# Patient Record
Sex: Female | Born: 1995 | Race: White | Hispanic: Yes | Marital: Single | Smoking: Former smoker
Health system: Southern US, Community
[De-identification: ages and names within clinical notes are randomized; demographics above are authoritative.]

## PROBLEM LIST (undated history)

## (undated) ENCOUNTER — Inpatient Hospital Stay (HOSPITAL_COMMUNITY): Payer: Self-pay

## (undated) DIAGNOSIS — E669 Obesity, unspecified: Secondary | ICD-10-CM

## (undated) DIAGNOSIS — J45909 Unspecified asthma, uncomplicated: Secondary | ICD-10-CM

---
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
## 2016-03-01 ENCOUNTER — Ambulatory Visit: Payer: Medicaid Other | Admitting: Family Medicine

## 2016-03-01 ENCOUNTER — Encounter: Payer: Self-pay | Admitting: Family Medicine

## 2016-03-23 ENCOUNTER — Ambulatory Visit: Payer: Medicaid Other | Admitting: Obstetrics & Gynecology

## 2016-03-29 IMAGING — US US OB DETAIL+14 WK
1 series · 12 of 28 positions shown · non-contrast
Comparison: none

[Series 1: us ob detail +14 wk · 12 of 100 slices shown]
[im 4/100]
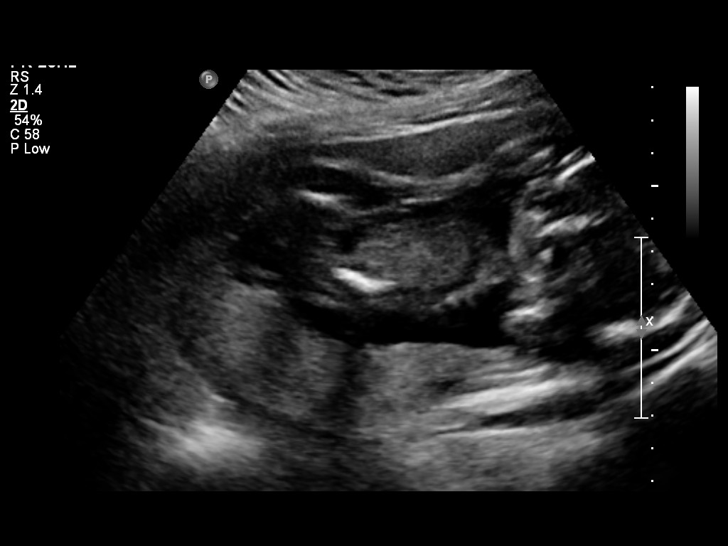
[im 12/100]
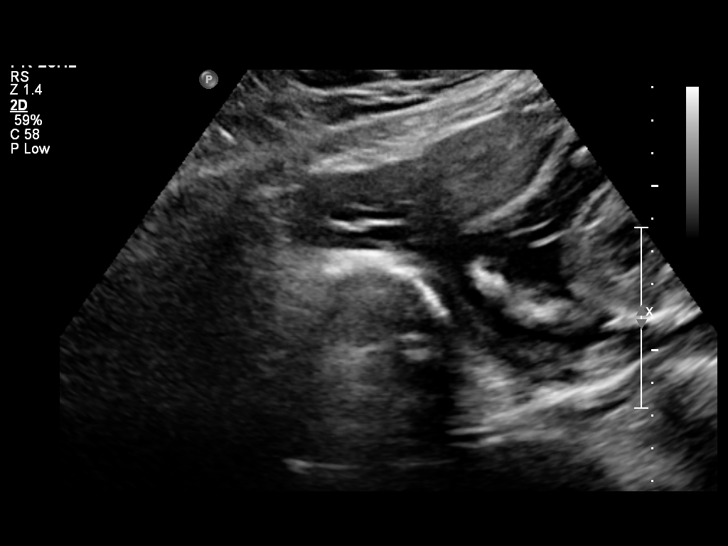
[im 19/100]
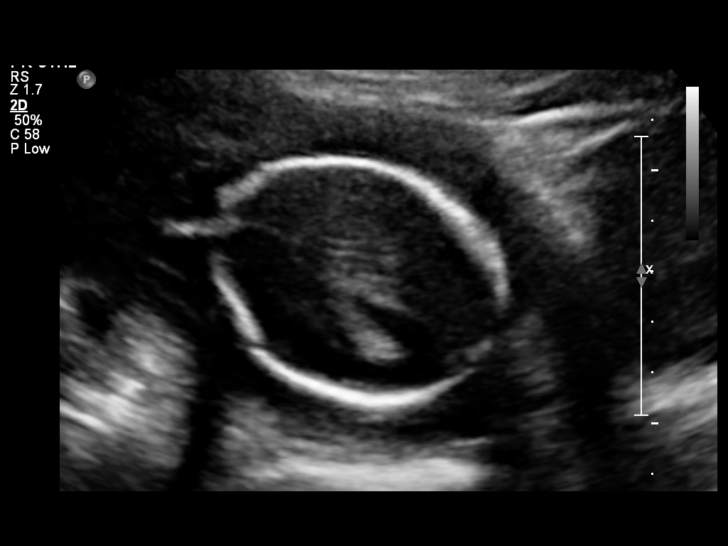
[im 30/100]
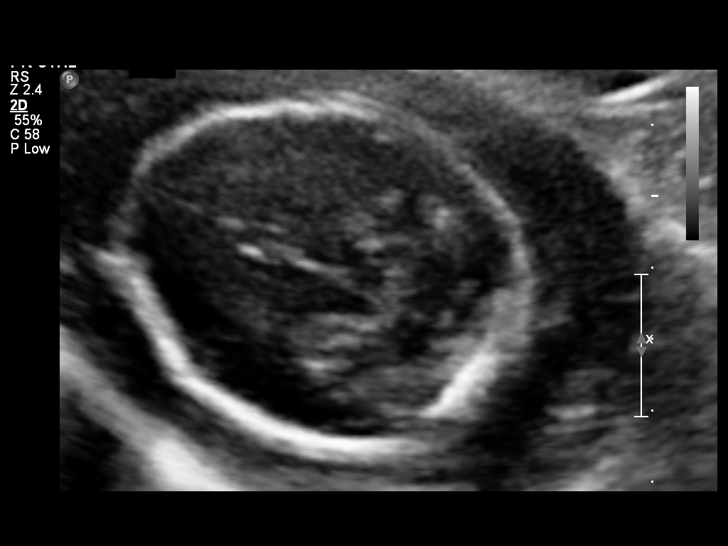
[im 37/100]
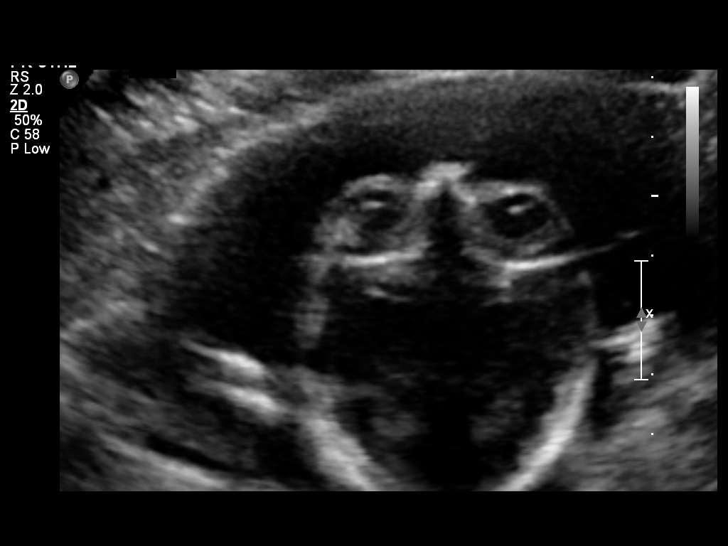
[im 45/100]
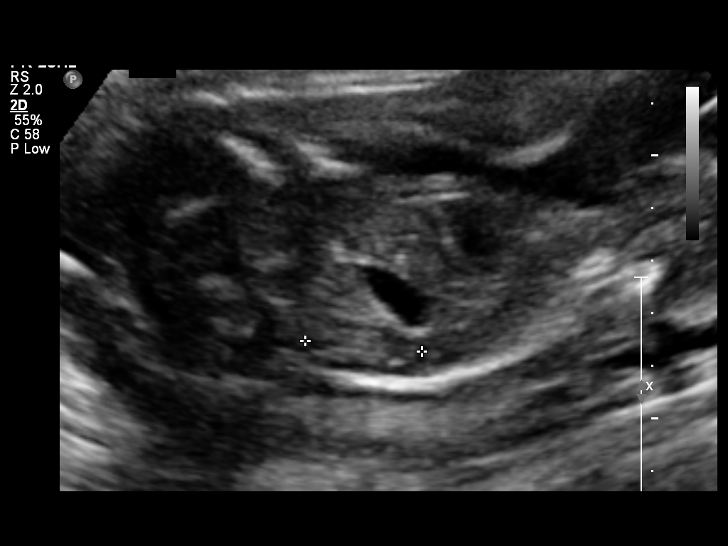
[im 56/100]
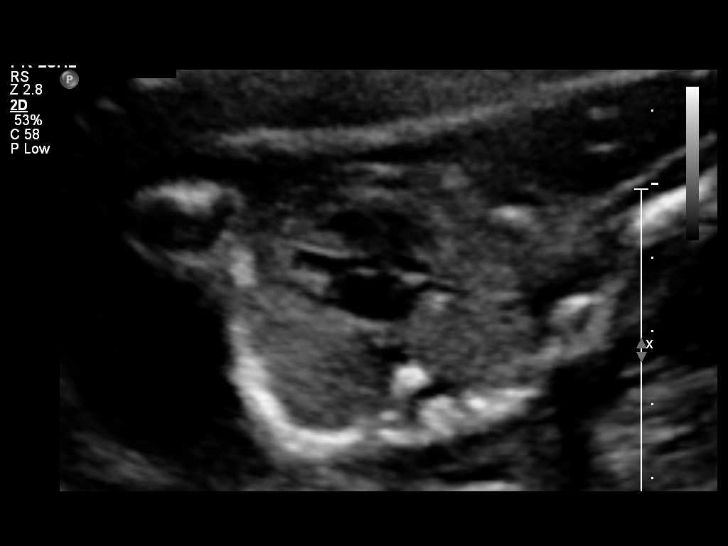
[im 63/100]
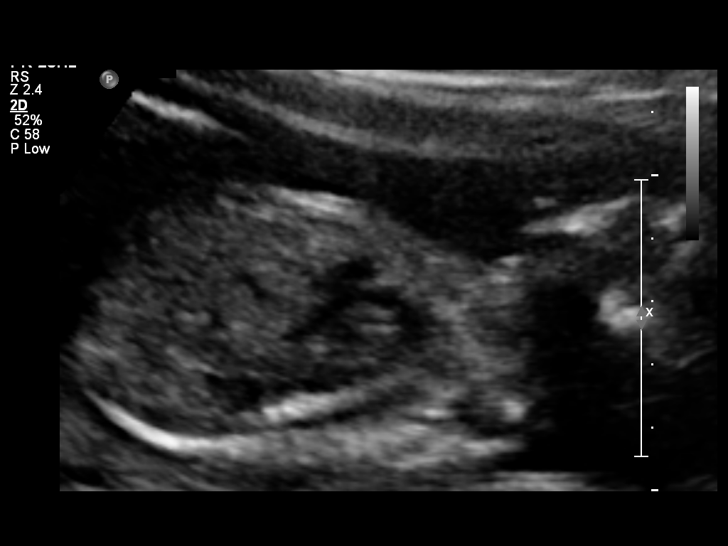
[im 70/100]
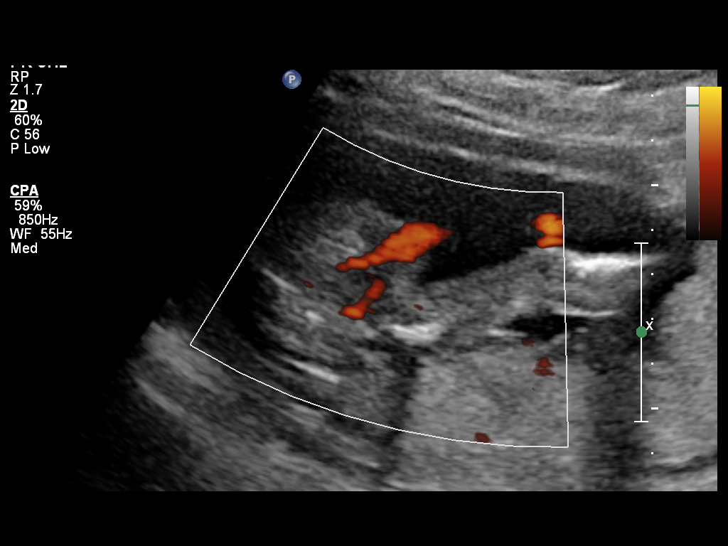
[im 81/100]
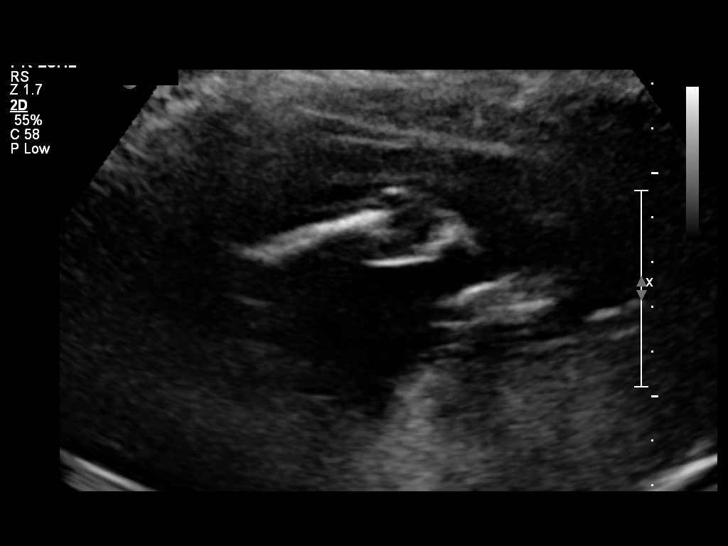
[im 89/100]
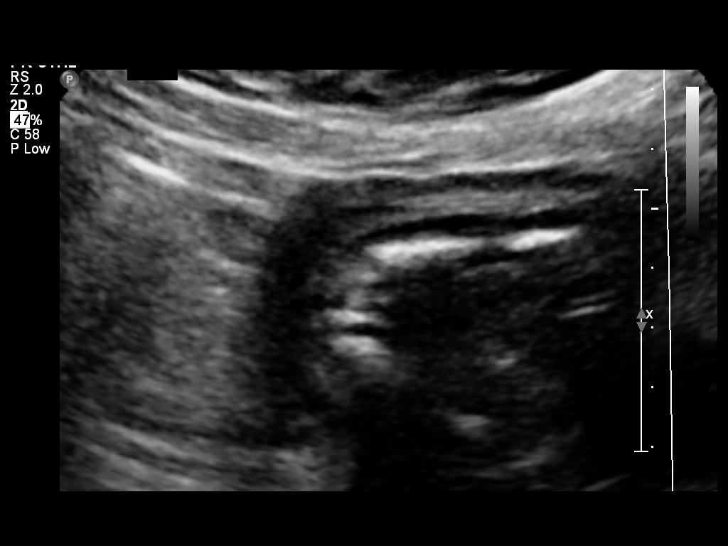
[im 96/100]
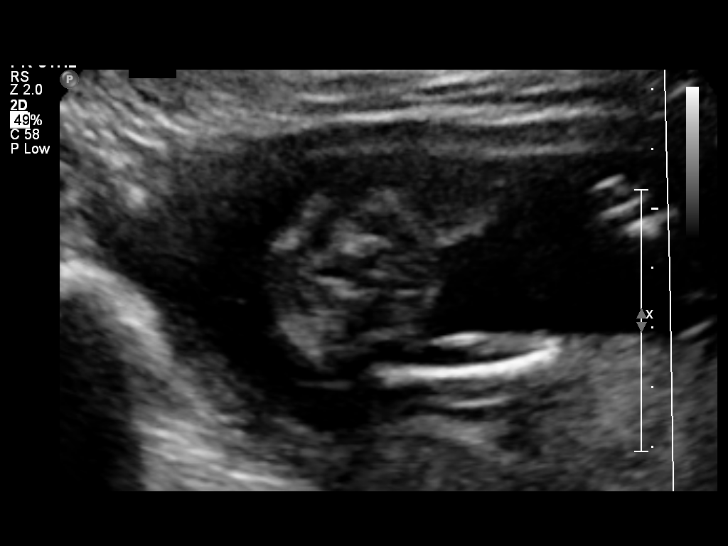

[12 of 28 positions shown; findings below may reference images not displayed]

OBSTETRICS REPORT
                      (Signed Final 12/01/2014 [DATE])

Service(s) Provided

 US OB DETAIL + 14 WK                                  76811.0
Indications

 Teen pregnancy
 Obesity complicating pregnancy BMI
 19 weeks gestation of pregnancy
Fetal Evaluation

 Num Of Fetuses:    1
 Fetal Heart Rate:  158                          bpm
 Cardiac Activity:  Observed
 Presentation:      Cephalic
 Placenta:          Posterior, above cervical
                    os
 P. Cord            Visualized, central
 Insertion:

 Amniotic Fluid
 AFI FV:      Subjectively within normal limits
                                             Larg Pckt:     4.5  cm
Biometry

 BPD:     45.3  mm     G. Age:  19w 5d                CI:        74.02   70 - 86
                                                      FL/HC:      20.4   16.8 -

 HC:     167.2  mm     G. Age:  19w 3d       35  %    HC/AC:      1.09   1.09 -

 AC:     153.4  mm     G. Age:  20w 4d       76  %    FL/BPD:
 FL:      34.1  mm     G. Age:  20w 5d       80  %    FL/AC:      22.2   20 - 24
 HUM:     29.7  mm     G. Age:  19w 5d       56  %
 CER:     18.6  mm     G. Age:  18w 2d       14  %
 NFT:     3.95  mm

 Est. FW:     355  gm    0 lb 13 oz      60  %
Gestational Age

 LMP:           24w 2d        Date:  06/14/14                 EDD:   03/21/15
 U/S Today:     20w 1d                                        EDD:   04/19/15
 Best:          19w 4d     Det. By:  Early Ultrasound         EDD:   04/23/15
                                     (08/30/14)
Anatomy

 Cranium:          Appears normal         Aortic Arch:      Appears normal
 Fetal Cavum:      Appears normal         Ductal Arch:      Appears normal
 Ventricles:       Appears normal         Diaphragm:        Not well visualized
 Choroid Plexus:   Appears normal         Stomach:          Appears normal, left
                                                            sided
 Cerebellum:       Appears normal         Abdomen:          Appears normal
 Posterior Fossa:  Appears normal         Abdominal Wall:   Appears nml (cord
                                                            insert, abd wall)
 Nuchal Fold:      Appears normal         Cord Vessels:     Appears normal (3
                                                            vessel cord)
 Face:             Appears normal         Kidneys:          Appear normal
                   (orbits and profile)
 Lips:             Appears normal         Bladder:          Appears normal
 Heart:            Not well visualized    Spine:            Limited views
                                                            appear normal
 RVOT:             Appears normal         Lower             Appears normal
                                          Extremities:
 LVOT:             Appears normal         Upper             Appears normal
                                          Extremities:

 Other:  Fetus appears to be a female. Nasal bone visualized. Technically
         difficult due to  maternal habitus.
Targeted Anatomy

 Fetal Central Nervous System
 Lat. Ventricles:  7.4                    Cisterna Magna:
Cervix Uterus Adnexa

 Cervical Length:    3.83     cm

 Cervix:       Normal appearance by transabdominal scan.
 Uterus:       No abnormality visualized.

 Left Ovary:    Not visualized.
 Right Ovary:   Not visualized.
 Adnexa:     No adnexal mass visualized.
Impression

 Single IUP at 19w 4d
 Limited views of the fetal heart (4CH), diaphragm and spine
 obtained
 The remainder of the fetal anatomy appears normal
 Posterior placenta without previa
 Normal amniotic fluid volume
Recommendations

 Recommend follow-up ultrasound examination in 4 weeks to
 reevaluate fetal anatomy

## 2016-05-01 IMAGING — US US OB FOLLOW-UP
1 series · 12 of 28 positions shown · non-contrast
Comparison: none

[Series 1: us ob follow up · 12 of 53 slices shown]
[im 2/53]
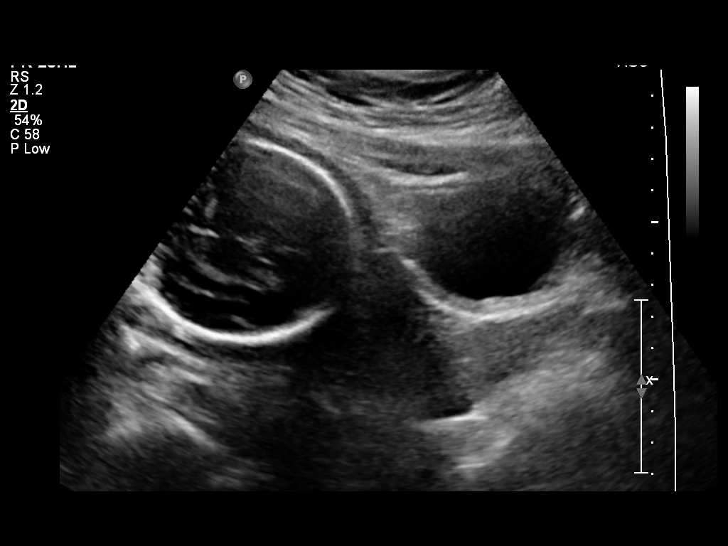
[im 6/53]
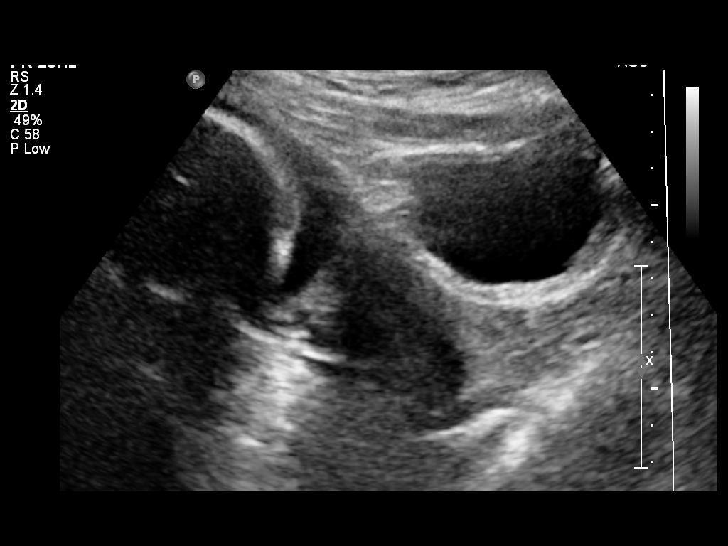
[im 10/53]
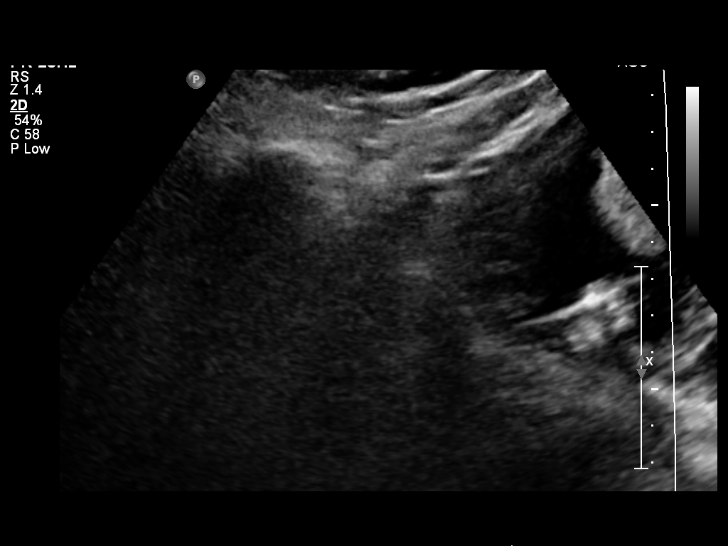
[im 16/53]
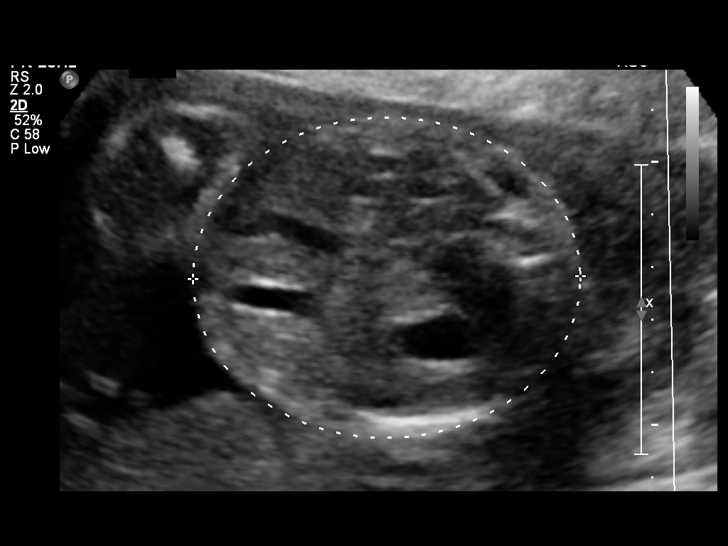
[im 20/53]
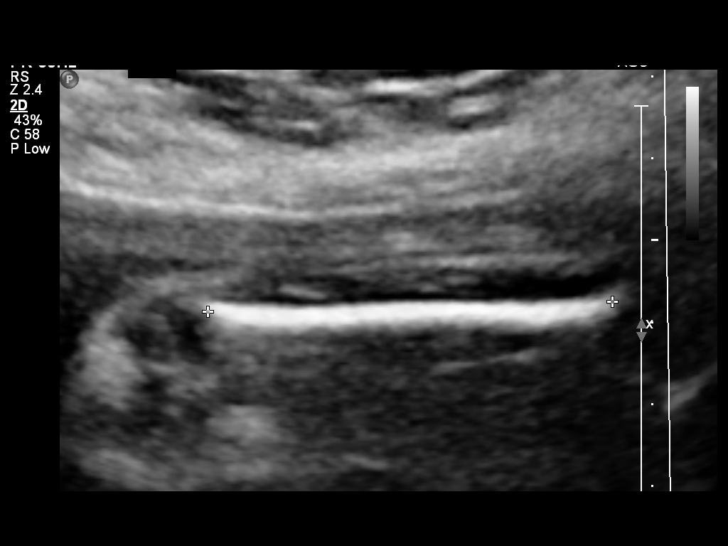
[im 24/53]
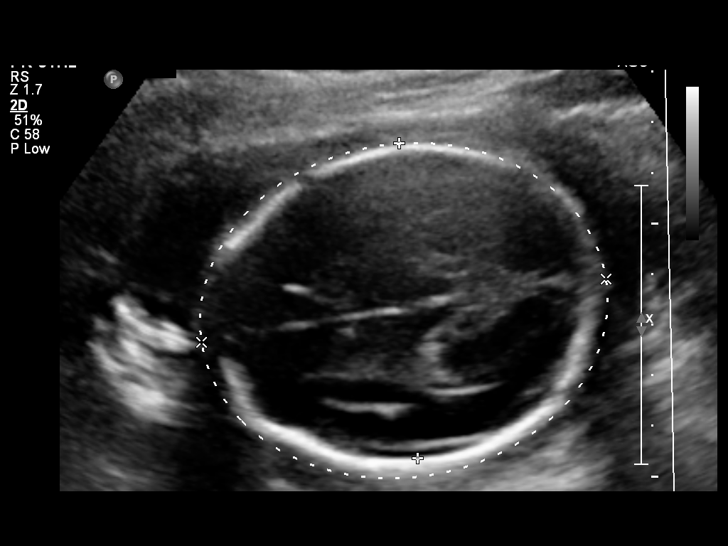
[im 29/53]
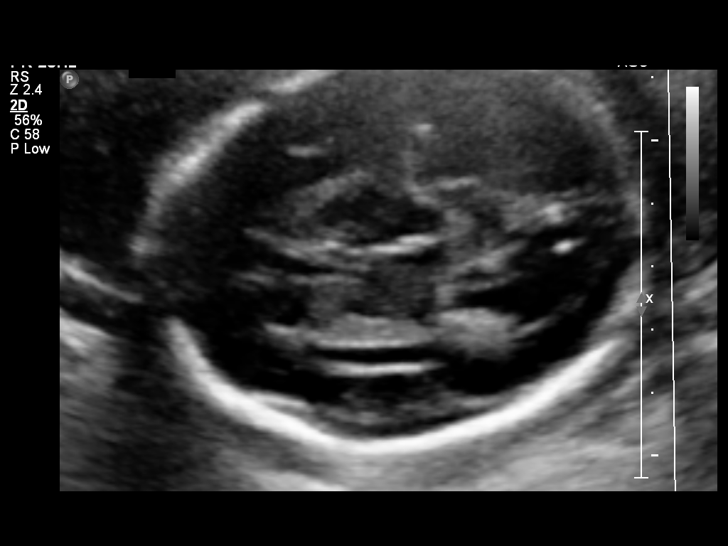
[im 33/53]
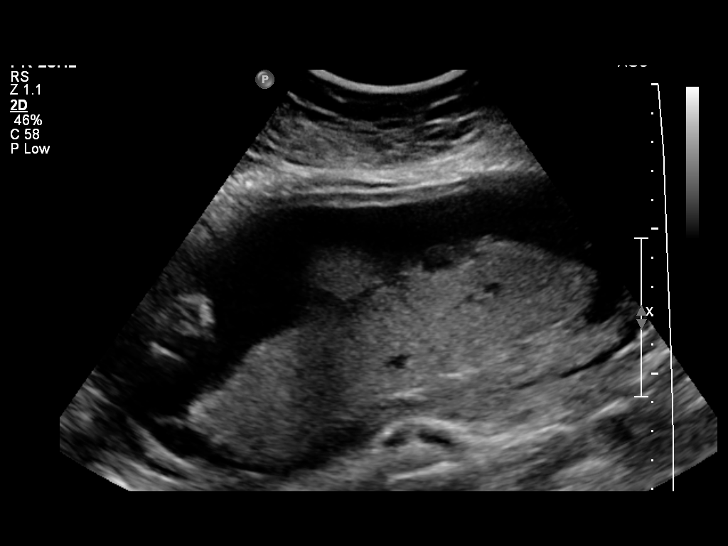
[im 37/53]
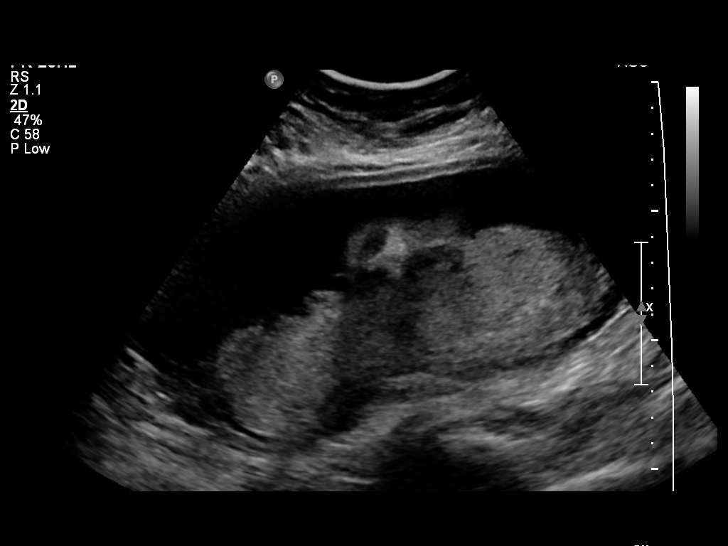
[im 43/53]
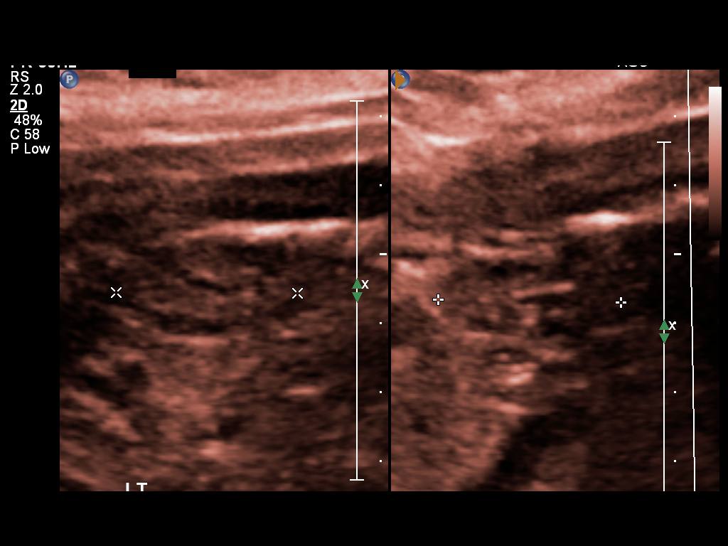
[im 47/53]
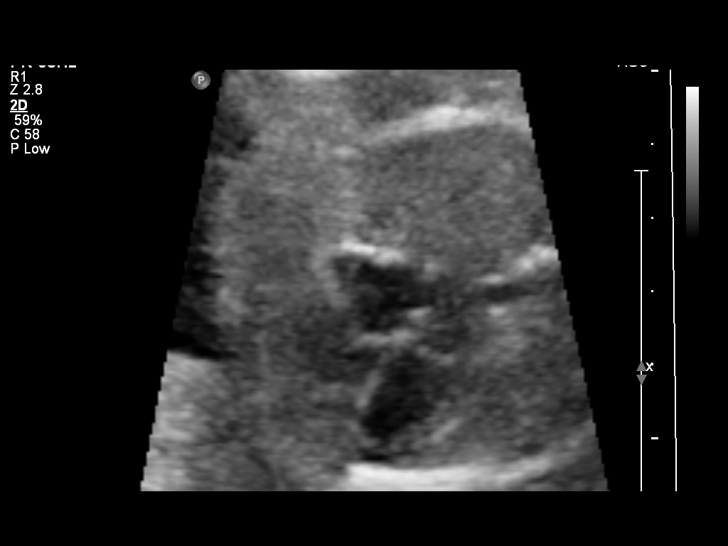
[im 51/53]
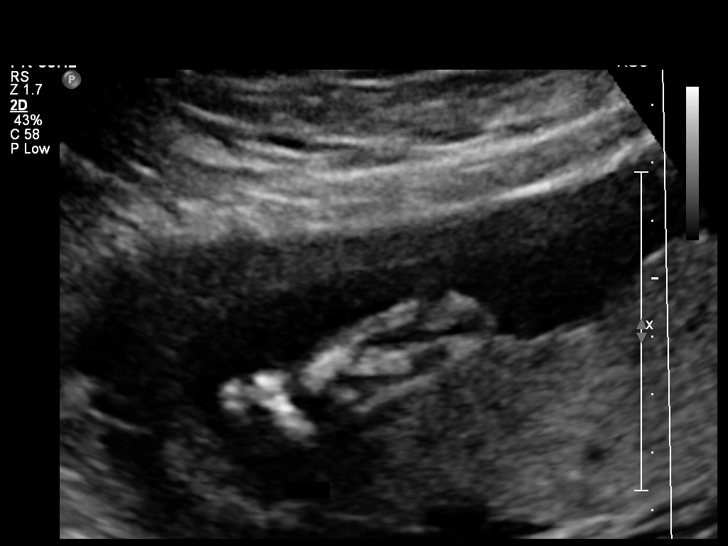

[12 of 28 positions shown; findings below may reference images not displayed]

OBSTETRICS REPORT
                      (Signed Final 01/03/2015 [DATE])

Service(s) Provided

 US OB FOLLOW UP                                       76816.1
Indications

 Obesity complicating pregnancy BMI
 24 weeks gestation of pregnancy
 Follow-up incomplete fetal anatomic evaluation        Z36
Fetal Evaluation

 Num Of Fetuses:    1
 Fetal Heart Rate:  160                          bpm
 Cardiac Activity:  Observed
 Presentation:      Cephalic
 Placenta:          Posterior, above cervical
                    os

 Amniotic Fluid
 AFI FV:      Subjectively within normal limits
                                             Larg Pckt:    4.74  cm
Biometry

 BPD:     61.4  mm     G. Age:  25w 0d                CI:        70.16   70 - 86
                                                      FL/HC:      21.0   18.7 -

 HC:     233.8  mm     G. Age:  25w 3d       73  %    HC/AC:      1.10   1.05 -

 AC:     212.6  mm     G. Age:  25w 6d       83  %    FL/BPD:     80.0   71 - 87
 FL:      49.1  mm     G. Age:  26w 4d       93  %    FL/AC:      23.1   20 - 24
 HUM:     42.3  mm     G. Age:  25w 3d       69  %
 Est. FW:     874  gm    1 lb 15 oz      78  %
Gestational Age

 LMP:           29w 0d        Date:  06/14/14                 EDD:   03/21/15
 U/S Today:     25w 5d                                        EDD:   04/13/15
 Best:          24w 2d     Det. By:  Early Ultrasound         EDD:   04/23/15
                                     (08/30/14)
Anatomy

 Cranium:          Appears normal         Aortic Arch:      Previously seen
 Fetal Cavum:      Previously seen        Ductal Arch:      Previously seen
 Ventricles:       Appears normal         Diaphragm:        Not well visualized
 Choroid Plexus:   Previously seen        Stomach:          Appears normal, left
                                                            sided
 Cerebellum:       Previously seen        Abdomen:          Previously seen
 Posterior Fossa:  Previously seen        Abdominal Wall:   Previously seen
 Nuchal Fold:      Not applicable (>20    Cord Vessels:     Previously seen
                   wks GA)
 Face:             Orbits and profile     Kidneys:          Appear normal
                   previously seen
 Lips:             Previously seen        Bladder:          Appears normal
 Heart:            Limited views but      Spine:            Limited views
                   appeared normal                          previously seen
 RVOT:             Previously seen        Lower             Previously seen
                                          Extremities:
 LVOT:             Previously seen        Upper             Previously seen
                                          Extremities:

 Other:  Fetus appears to be a female. Nasal bone previously visualized.
         Technically difficult due to  maternal habitus.
Targeted Anatomy

 Fetal Central Nervous System
 Lat. Ventricles:
Cervix Uterus Adnexa

 Cervical Length:    4.62     cm

 Cervix:       Normal appearance by transabdominal scan.
 Uterus:       No abnormality visualized.

 Left Ovary:    Not visualized.
 Right Ovary:   Not visualized.
 Adnexa:     No abnormality visualized. No adnexal mass visualized.
Impression

 SIUP at 24+2 weeks
 Normal interval anatomy; anatomic survey complete
 Normal amniotic fluid volume
 Appropriate interval growth with EFW at the 78th %tile
Recommendations

 Follow-up ultrasound for growth in 6-8 weeks (BMI)

## 2016-06-07 ENCOUNTER — Inpatient Hospital Stay (HOSPITAL_COMMUNITY)
Admission: AD | Admit: 2016-06-07 | Discharge: 2016-06-08 | Disposition: A | Discharge status: Home / Self Care | Payer: Medicaid Other | Source: Ambulatory Visit | Attending: Obstetrics and Gynecology | Admitting: Obstetrics and Gynecology

## 2016-06-07 DIAGNOSIS — K429 Umbilical hernia without obstruction or gangrene: Secondary | ICD-10-CM | POA: Insufficient documentation

## 2016-06-07 DIAGNOSIS — R109 Unspecified abdominal pain: Secondary | ICD-10-CM | POA: Insufficient documentation

## 2016-06-07 DIAGNOSIS — J45909 Unspecified asthma, uncomplicated: Secondary | ICD-10-CM | POA: Insufficient documentation

## 2016-06-07 DIAGNOSIS — F17290 Nicotine dependence, other tobacco product, uncomplicated: Secondary | ICD-10-CM | POA: Insufficient documentation

## 2016-06-07 LAB — URINALYSIS, ROUTINE W REFLEX MICROSCOPIC
BILIRUBIN URINE: NEGATIVE
GLUCOSE, UA: NEGATIVE mg/dL
HGB URINE DIPSTICK: NEGATIVE
KETONES UR: 15 mg/dL — AB
Leukocytes, UA: NEGATIVE
NITRITE: NEGATIVE
PH: 5.5 (ref 5.0–8.0)
Protein, ur: NEGATIVE mg/dL

## 2016-06-07 LAB — POCT PREGNANCY, URINE: PREG TEST UR: NEGATIVE

## 2016-06-07 NOTE — MAU Note (Signed)
PT SAYS  SHE HAS PELVIC PAIN - BEGAN IN June.  BLADDER   , LOWER ABD AND BACK  HURTS-  PT  IS  A ROOFER.   H/A STARTED 1 WEEK AGO-   TOOK TYLENOL 1 TAB    500 MG  ON SAT.   HAS IUD -  BY CLINIC   LAST SEX- MAY.  ALSO FEELS  HARD  BALL IN ABD.

## 2016-06-08 ENCOUNTER — Emergency Department (HOSPITAL_COMMUNITY)
Admission: EM | Admit: 2016-06-08 | Discharge: 2016-06-08 | Disposition: A | Discharge status: Home / Self Care | Payer: Self-pay | Attending: Emergency Medicine | Admitting: Emergency Medicine

## 2016-06-08 ENCOUNTER — Encounter (HOSPITAL_COMMUNITY): Payer: Self-pay

## 2016-06-08 DIAGNOSIS — K429 Umbilical hernia without obstruction or gangrene: Secondary | ICD-10-CM | POA: Insufficient documentation

## 2016-06-08 DIAGNOSIS — R197 Diarrhea, unspecified: Secondary | ICD-10-CM

## 2016-06-08 DIAGNOSIS — R1084 Generalized abdominal pain: Secondary | ICD-10-CM

## 2016-06-08 DIAGNOSIS — J45909 Unspecified asthma, uncomplicated: Secondary | ICD-10-CM | POA: Insufficient documentation

## 2016-06-08 DIAGNOSIS — F17210 Nicotine dependence, cigarettes, uncomplicated: Secondary | ICD-10-CM | POA: Insufficient documentation

## 2016-06-08 LAB — CBC
HCT: 39.4 % (ref 36.0–46.0)
Hemoglobin: 12.7 g/dL (ref 12.0–15.0)
MCH: 27.1 pg (ref 26.0–34.0)
MCHC: 32.2 g/dL (ref 30.0–36.0)
MCV: 84 fL (ref 78.0–100.0)
PLATELETS: 296 10*3/uL (ref 150–400)
RBC: 4.69 MIL/uL (ref 3.87–5.11)
RDW: 15.3 % (ref 11.5–15.5)
WBC: 8.5 10*3/uL (ref 4.0–10.5)

## 2016-06-08 LAB — URINALYSIS, ROUTINE W REFLEX MICROSCOPIC
Bilirubin Urine: NEGATIVE
GLUCOSE, UA: NEGATIVE mg/dL
Hgb urine dipstick: NEGATIVE
KETONES UR: NEGATIVE mg/dL
NITRITE: POSITIVE — AB
PROTEIN: NEGATIVE mg/dL
Specific Gravity, Urine: 1.034 — ABNORMAL HIGH (ref 1.005–1.030)
pH: 5.5 (ref 5.0–8.0)

## 2016-06-08 LAB — URINE MICROSCOPIC-ADD ON

## 2016-06-08 LAB — COMPREHENSIVE METABOLIC PANEL
ALK PHOS: 84 U/L (ref 38–126)
ALT: 30 U/L (ref 14–54)
AST: 26 U/L (ref 15–41)
Albumin: 4.5 g/dL (ref 3.5–5.0)
Anion gap: 6 (ref 5–15)
BILIRUBIN TOTAL: 0.5 mg/dL (ref 0.3–1.2)
BUN: 13 mg/dL (ref 6–20)
CO2: 24 mmol/L (ref 22–32)
CREATININE: 0.59 mg/dL (ref 0.44–1.00)
Calcium: 9.3 mg/dL (ref 8.9–10.3)
Chloride: 109 mmol/L (ref 101–111)
GFR calc Af Amer: >60 mL/min (ref >60)
Glucose, Bld: 104 mg/dL — ABNORMAL HIGH (ref 65–99)
Potassium: 3.8 mmol/L (ref 3.5–5.1)
Sodium: 139 mmol/L (ref 135–145)
TOTAL PROTEIN: 7.8 g/dL (ref 6.5–8.1)

## 2016-06-08 LAB — LIPASE, BLOOD: Lipase: 20 U/L (ref 11–51)

## 2016-06-08 LAB — I-STAT BETA HCG BLOOD, ED (MC, WL, AP ONLY): I-stat hCG, quantitative: <5 m[IU]/mL (ref <5)

## 2016-06-08 MED ORDER — DIPHENOXYLATE-ATROPINE 2.5-0.025 MG PO TABS: 2.0000 | ORAL_TABLET | Freq: Once | ORAL | Status: DC

## 2016-06-08 MED ORDER — DICYCLOMINE HCL 10 MG PO CAPS: 10.0000 mg | ORAL_CAPSULE | Freq: Once | ORAL | Status: DC

## 2016-06-08 NOTE — MAU Provider Note (Signed)
History     CSN: [id number]  Arrival date and time: 06/07/16 2151   First Provider Initiated Contact with Patient 06/08/16 0038      Chief Complaint  Patient presents with  . Abdominal Pain   Charlotte [name] is a 20 y.o. Who presents today because she will occasionally feel a "hard ball" in her stomach near her belly button. She states that she has noticed this for about one month, and it is usually only present after working. 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 She states that after a long day on her feet with heavy lifting she feels the large bump. She states that it is not there right now.    Abdominal Pain  This is a new problem. The current episode started 1 to 4 weeks ago. The onset quality is gradual. The problem occurs intermittently. The problem has been unchanged. The pain is located in the periumbilical region. The pain is at a severity of 0/10 (occasinally feels like stomache tightens up. ). The abdominal pain radiates to the suprapubic region. Associated symptoms include dysuria and nausea. Pertinent negatives include no constipation, diarrhea, fever, frequency or vomiting. Exacerbated by: lifting  The pain is relieved by nothing. She has tried nothing for the symptoms.    Past Medical History:  Diagnosis Date  . Asthma    given an inhaler 6th grade    Past Surgical History:  Procedure Laterality Date  . CESAREAN SECTION N/A 05/02/2015   Procedure: CESAREAN SECTION;  Surgeon: [name], MD;  Location: [facility];  Service: Obstetrics;  Laterality: N/A;    Family History  Problem Relation Age of Onset  . Cancer Paternal Grandmother   . Diabetes Mother   . Diabetes Maternal Grandmother     Social History  Substance Use Topics  . Smoking status: Current Some Day Smoker    Packs/day: 0.25    Types: Cigars  . Smokeless tobacco: Never Used  . Alcohol use No    Allergies: No Known Allergies  Prescriptions  Prior to Admission  Medication Sig Dispense Refill Last Dose  . NYSTATIN, TOPICAL, POWD Apply 1 application topically 3 (three) times daily. 30 g 0     Review of Systems  Constitutional: Negative for chills and fever.  Gastrointestinal: Positive for abdominal pain and nausea. Negative for constipation, diarrhea and vomiting.  Genitourinary: Positive for dysuria. Negative for frequency and urgency.   Physical Exam   Blood pressure 135/74, pulse 69, temperature 98.4 F (36.9 C), temperature source Oral, resp. rate 20, height 5\' 6"  (1.676 m), weight 262 lb 8 oz (119.1 kg), last menstrual period 03/26/2016, not currently breastfeeding.  Physical Exam  Nursing note and vitals reviewed. Constitutional: She is oriented to person, place, and time. She appears well-developed and well-nourished. No distress.  HENT:  Head: Normocephalic.  Cardiovascular: Normal rate.   Respiratory: Effort normal.  GI: Soft. There is no tenderness. There is no rebound.  Musculoskeletal: Normal range of motion.  Neurological: She is alert and oriented to person, place, and time.  Skin: Skin is warm and dry.  Psychiatric: She has a normal mood and affect.    MAU Course  Procedures  MDM    Assessment and Plan   1. Umbilical hernia without obstruction and without gangrene    DC home Comfort measures reviewed  Warning signs reviewed  RX: none  Return to MAU as needed FU with OB as planned  Follow-up Information    [facility] [place] [facility] .   Contact information: [address] [id number] mc [place] [address] [phone]           Tawnya Crook 06/08/2016, 12:39 AM

## 2016-06-08 NOTE — Discharge Instructions (Signed)
Use Bentyl for abdominal cramping.  Lomotil for diarrhea.  You have an umbilical hernia that does not appear to be causing your pain. However, you get worsening pain near her umbilicus or a hard firm mass near your belly button, please return for reevaluation

## 2016-06-08 NOTE — ED Provider Notes (Signed)
[facility]-EMERGENCY DEPT Provider Note   CSN: [id number] Arrival date & time: 06/08/16  1628  First Provider Contact:  1955 PM       History   Chief Complaint Chief Complaint  Patient presents with  . Abdominal Pain  . Diarrhea    HPI Charlotte [name] is a 20 y.o. female.  She presents for evaluation of diarrhea. She's had diarrhea for 3 weeks. Also complains of abdominal pain. Seen at [facility] hospital last night and was told that she had a perineal hernia. States that sometimes when she lifts heavy things she gets pain. States pulsatile as cramping pain. She has diarrhea daily 1-3 times. No blood pus or mucus. No recent antibiotics.  HPI  Past Medical History:  Diagnosis Date  . Asthma    given an inhaler 6th grade    Patient Active Problem List   Diagnosis Date Noted  . IUD check up 07/14/2015  . Obesity 10/18/2014  . Hidradenitis suppurativa 10/18/2014    Past Surgical History:  Procedure Laterality Date  . CESAREAN SECTION N/A 05/02/2015   Procedure: CESAREAN SECTION;  Surgeon: [name], MD;  Location: [facility];  Service: Obstetrics;  Laterality: N/A;    OB History    Gravida Para Term Preterm AB Living   1 1 1     1    SAB TAB Ectopic Multiple Live Births         0 1       Home Medications    Prior to Admission medications   Medication Sig Start Date End Date Taking? Authorizing Provider  NYSTATIN, TOPICAL, POWD Apply 1 application topically 3 (three) times daily. 09/01/15   Judeth Horn, NP    Family History Family History  Problem Relation Age of Onset  . Cancer Paternal Grandmother   . Diabetes Mother   . Diabetes Maternal Grandmother     Social History Social History  Substance Use Topics  . Smoking status: Current Some Day Smoker    Packs/day: 0.25    Types: Cigars  . Smokeless tobacco: Never Used  . Alcohol use No     Allergies   Review of patient's allergies indicates no known allergies.   Review of Systems Review of  Systems  Constitutional: Negative for appetite change, chills, diaphoresis, fatigue and fever.  HENT: Negative for mouth sores, sore throat and trouble swallowing.   Eyes: Negative for visual disturbance.  Respiratory: Negative for cough, chest tightness, shortness of breath and wheezing.   Cardiovascular: Negative for chest pain.  Gastrointestinal: Positive for abdominal pain and diarrhea. Negative for abdominal distention, nausea and vomiting.  Endocrine: Negative for polydipsia, polyphagia and polyuria.  Genitourinary: Negative for dysuria, frequency and hematuria.  Musculoskeletal: Negative for gait problem.  Skin: Negative for color change, pallor and rash.  Neurological: Negative for dizziness, syncope, light-headedness and headaches.  Hematological: Does not bruise/bleed easily.  Psychiatric/Behavioral: Negative for behavioral problems and confusion.     Physical Exam Updated Vital Signs BP (!) 108/49 (BP Location: Left Arm)   Pulse 69   Temp 98.5 F (36.9 C) (Oral)   Resp 18   LMP 03/26/2016   SpO2 100%   Physical Exam  Constitutional: She is oriented to person, place, and time. She appears well-developed and well-nourished. No distress.  HENT:  Head: Normocephalic.  Eyes: Conjunctivae are normal. Pupils are equal, round, and reactive to light. No scleral icterus.  Neck: Normal range of motion. Neck supple. No thyromegaly present.  Cardiovascular: Normal rate and regular  rhythm.  Exam reveals no gallop and no friction rub.   No murmur heard. Pulmonary/Chest: Effort normal and breath sounds normal. No respiratory distress. She has no wheezes. She has no rales.  Abdominal: Soft. Bowel sounds are normal. She exhibits no distension. There is no tenderness. There is no rebound.  Palpable periumbilical hernia without current herniation of intra-abdominal contents.  Musculoskeletal: Normal range of motion.  Neurological: She is alert and oriented to person, place, and time.    Skin: Skin is warm and dry. No rash noted.  Psychiatric: She has a normal mood and affect. Her behavior is normal.     ED Treatments / Results  Labs (all labs ordered are listed, but only abnormal results are displayed) Labs Reviewed  COMPREHENSIVE METABOLIC PANEL - Abnormal; Notable for the following:       Result Value   Glucose, Bld 104 (*)    All other components within normal limits  LIPASE, BLOOD  CBC  URINALYSIS, ROUTINE W REFLEX MICROSCOPIC (NOT AT [facility])  I-STAT BETA HCG BLOOD, ED ([facility], [facility], [facility] ONLY)    EKG  EKG Interpretation None       Radiology No results found.  Procedures Procedures (including critical care time)  Medications Ordered in ED Medications  dicyclomine (BENTYL) capsule 10 mg (not administered)  diphenoxylate-atropine (LOMOTIL) 2.5-0.025 MG per tablet 2 tablet (not administered)     Initial Impression / Assessment and Plan / ED Course  I have reviewed the triage vital signs and the nursing notes.  Pertinent labs & imaging results that were available during my care of the patient were reviewed by me and considered in my medical decision making (see chart for details).  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 It is not tender. There is no obvious palpable in extrusion of intra-abdominal contents. This does not seem to reproduce her symptoms.  Final Clinical Impressions(s) / ED Diagnoses   Final diagnoses:  Generalized abdominal pain  Diarrhea, unspecified type  Umbilical hernia without obstruction and without gangrene    New Prescriptions New Prescriptions   No medications on file     Rolland Porter, MD 06/08/16 2011

## 2016-06-08 NOTE — ED Triage Notes (Signed)
Pt c/o lower abdominal pain and diarrhea x 3 weeks.  Pain score 4/10 increasing w/ heavy lifting.  Pt reports that she had a C section in June 2016 and sts "I didn't take care of myself."  Pt was seen at [facility] yesterday and diagnosed w/ a hernia.  No CT or [place] was completed.

## 2016-06-08 NOTE — Discharge Instructions (Signed)
Hernia, Adult A hernia is the bulging of an organ or tissue through a weak spot in the muscles of the abdomen (abdominal wall). Hernias develop most often near the navel or groin. There are many kinds of hernias. Common kinds include:  Femoral hernia. This kind of hernia develops under the groin in the upper thigh area.  Inguinal hernia. This kind of hernia develops in the groin or scrotum.  Umbilical hernia. This kind of hernia develops near the navel.  Hiatal hernia. This kind of hernia causes part of the stomach to be pushed up into the chest.  Incisional hernia. This kind of hernia bulges through a scar from an abdominal surgery. CAUSES This condition may be caused by:  Heavy lifting.  Coughing over a long period of time.  Straining to have a bowel movement.  An incision made during an abdominal surgery.  A birth defect (congenital defect).  Excess weight or obesity.  Smoking.  Poor nutrition.  Cystic fibrosis.  Excess fluid in the abdomen.  Undescended testicles. SYMPTOMS Symptoms of a hernia include:  A lump on the abdomen. This is the first sign of a hernia. The lump may become more obvious with standing, straining, or coughing. It may get bigger over time if it is not treated or if the condition causing it is not treated.  Pain. A hernia is usually painless, but it may become painful over time if treatment is delayed. The pain is usually dull and may get worse with standing or lifting heavy objects. Sometimes a hernia gets tightly squeezed in the weak spot (strangulated) or stuck there (incarcerated) and causes additional symptoms. These symptoms may include:  Vomiting.  Nausea.  Constipation.  Irritability. DIAGNOSIS A hernia may be diagnosed with:  A physical exam. During the exam your health care provider may ask you to cough or to make a specific movement, because a hernia is usually more visible when you move.  Imaging tests. These can  include:  X-rays.  Ultrasound.  CT scan. TREATMENT A hernia that is small and painless may not need to be treated. A hernia that is large or painful may be treated with surgery. Inguinal hernias may be treated with surgery to prevent incarceration or strangulation. Strangulated hernias are always treated with surgery, because lack of blood to the trapped organ or tissue can cause it to die. Surgery to treat a hernia involves pushing the bulge back into place and repairing the weak part of the abdomen. HOME CARE INSTRUCTIONS  Avoid straining.  Do not lift anything heavier than 10 lb (4.5 kg).  Lift with your leg muscles, not your back muscles. This helps avoid strain.  When coughing, try to cough gently.  Prevent constipation. Constipation leads to straining with bowel movements, which can make a hernia worse or cause a hernia repair to break down. You can prevent constipation by:  Eating a high-fiber diet that includes plenty of fruits and vegetables.  Drinking enough fluids to keep your urine clear or pale yellow. Aim to drink 6-8 glasses of water per day.  Using a stool softener as directed by your health care provider.  Lose weight, if you are overweight.  Do not use any tobacco products, including cigarettes, chewing tobacco, or electronic cigarettes. If you need help quitting, ask your health care provider.  Keep all follow-up visits as directed by your health care provider. This is important. Your health care provider may need to monitor your condition. SEEK MEDICAL CARE IF:  You have   swelling, redness, and pain in the affected area.  Your bowel habits change. SEEK IMMEDIATE MEDICAL CARE IF:  You have a fever.  You have abdominal pain that is getting worse.  You feel nauseous or you vomit.  You cannot push the hernia back in place by gently pressing on it while you are lying down.  The hernia:  Changes in shape or size.  Is stuck outside the  abdomen.  Becomes discolored.  Feels hard or tender.   This information is not intended to replace advice given to you by your health care provider. Make sure you discuss any questions you have with your health care provider.   Document Released: 10/29/2005 Document Revised: 11/19/2014 Document Reviewed: 09/08/2014 Elsevier Interactive Patient Education 2016 [employer]  

## 2016-08-09 ENCOUNTER — Emergency Department (HOSPITAL_COMMUNITY): Payer: Self-pay

## 2016-08-09 ENCOUNTER — Emergency Department (HOSPITAL_COMMUNITY)
Admission: EM | Admit: 2016-08-09 | Discharge: 2016-08-09 | Disposition: A | Discharge status: Home / Self Care | Payer: Self-pay | Attending: Emergency Medicine | Admitting: Emergency Medicine

## 2016-08-09 ENCOUNTER — Encounter (HOSPITAL_COMMUNITY): Payer: Self-pay | Admitting: Emergency Medicine

## 2016-08-09 DIAGNOSIS — R05 Cough: Secondary | ICD-10-CM | POA: Insufficient documentation

## 2016-08-09 DIAGNOSIS — Z5321 Procedure and treatment not carried out due to patient leaving prior to being seen by health care provider: Secondary | ICD-10-CM | POA: Insufficient documentation

## 2016-08-09 DIAGNOSIS — R1033 Periumbilical pain: Secondary | ICD-10-CM | POA: Insufficient documentation

## 2016-08-09 DIAGNOSIS — J45909 Unspecified asthma, uncomplicated: Secondary | ICD-10-CM | POA: Insufficient documentation

## 2016-08-09 DIAGNOSIS — Z87891 Personal history of nicotine dependence: Secondary | ICD-10-CM | POA: Insufficient documentation

## 2016-08-09 DIAGNOSIS — R1110 Vomiting, unspecified: Secondary | ICD-10-CM | POA: Insufficient documentation

## 2016-08-09 DIAGNOSIS — R079 Chest pain, unspecified: Secondary | ICD-10-CM | POA: Insufficient documentation

## 2016-08-09 LAB — COMPREHENSIVE METABOLIC PANEL
ALT: 39 U/L (ref 14–54)
AST: 38 U/L (ref 15–41)
Albumin: 4.2 g/dL (ref 3.5–5.0)
Alkaline Phosphatase: 75 U/L (ref 38–126)
Anion gap: 9 (ref 5–15)
BILIRUBIN TOTAL: 0.7 mg/dL (ref 0.3–1.2)
BUN: 11 mg/dL (ref 6–20)
CALCIUM: 9.5 mg/dL (ref 8.9–10.3)
CO2: 23 mmol/L (ref 22–32)
Chloride: 108 mmol/L (ref 101–111)
Creatinine, Ser: 0.81 mg/dL (ref 0.44–1.00)
GFR calc Af Amer: >60 mL/min (ref >60)
GLUCOSE: 104 mg/dL — AB (ref 65–99)
Potassium: 3.7 mmol/L (ref 3.5–5.1)
Sodium: 140 mmol/L (ref 135–145)
TOTAL PROTEIN: 7.3 g/dL (ref 6.5–8.1)

## 2016-08-09 LAB — CBC
HCT: 42.1 % (ref 36.0–46.0)
Hemoglobin: 13.3 g/dL (ref 12.0–15.0)
MCH: 27.9 pg (ref 26.0–34.0)
MCHC: 31.6 g/dL (ref 30.0–36.0)
MCV: 88.3 fL (ref 78.0–100.0)
PLATELETS: 276 10*3/uL (ref 150–400)
RBC: 4.77 MIL/uL (ref 3.87–5.11)
RDW: 14.3 % (ref 11.5–15.5)
WBC: 8.5 10*3/uL (ref 4.0–10.5)

## 2016-08-09 LAB — I-STAT TROPONIN, ED: Troponin i, poc: 0 ng/mL (ref 0.00–0.08)

## 2016-08-09 LAB — LIPASE, BLOOD: Lipase: 22 U/L (ref 11–51)

## 2016-08-09 MED ORDER — ONDANSETRON 4 MG PO TBDP
4.0000 mg | ORAL_TABLET | Freq: Once | ORAL | Status: AC | PRN
  Administered 2016-08-09: 4 mg via ORAL
  Filled 2016-08-09: qty 1

## 2016-08-09 NOTE — ED Triage Notes (Signed)
Onset one month ago chest pain worsening overtime and developed cough one day ago. Chest pain currently 3/10 with intermittent shortness of breath.  Also states periumbilical pain intermittently currently denies pain at this time. Emesis x1 today denies diarrhea.

## 2016-09-01 ENCOUNTER — Emergency Department (HOSPITAL_COMMUNITY)
Admission: EM | Admit: 2016-09-01 | Discharge: 2016-09-01 | Disposition: A | Discharge status: Home / Self Care | Payer: Self-pay | Attending: Emergency Medicine | Admitting: Emergency Medicine

## 2016-09-01 ENCOUNTER — Encounter (HOSPITAL_COMMUNITY): Payer: Self-pay | Admitting: *Deleted

## 2016-09-01 ENCOUNTER — Emergency Department (HOSPITAL_COMMUNITY): Payer: Self-pay

## 2016-09-01 DIAGNOSIS — R197 Diarrhea, unspecified: Secondary | ICD-10-CM

## 2016-09-01 DIAGNOSIS — A084 Viral intestinal infection, unspecified: Secondary | ICD-10-CM | POA: Insufficient documentation

## 2016-09-01 DIAGNOSIS — Z87891 Personal history of nicotine dependence: Secondary | ICD-10-CM | POA: Insufficient documentation

## 2016-09-01 DIAGNOSIS — R1013 Epigastric pain: Secondary | ICD-10-CM

## 2016-09-01 DIAGNOSIS — K760 Fatty (change of) liver, not elsewhere classified: Secondary | ICD-10-CM | POA: Insufficient documentation

## 2016-09-01 DIAGNOSIS — J45909 Unspecified asthma, uncomplicated: Secondary | ICD-10-CM | POA: Insufficient documentation

## 2016-09-01 DIAGNOSIS — R112 Nausea with vomiting, unspecified: Secondary | ICD-10-CM

## 2016-09-01 DIAGNOSIS — K2970 Gastritis, unspecified, without bleeding: Secondary | ICD-10-CM | POA: Insufficient documentation

## 2016-09-01 HISTORY — DX: Obesity, unspecified: E66.9

## 2016-09-01 LAB — COMPREHENSIVE METABOLIC PANEL
ALK PHOS: 89 U/L (ref 38–126)
ALT: 30 U/L (ref 14–54)
AST: 26 U/L (ref 15–41)
Albumin: 4 g/dL (ref 3.5–5.0)
Anion gap: 6 (ref 5–15)
BUN: 10 mg/dL (ref 6–20)
CALCIUM: 9.1 mg/dL (ref 8.9–10.3)
CHLORIDE: 107 mmol/L (ref 101–111)
CO2: 24 mmol/L (ref 22–32)
CREATININE: 0.67 mg/dL (ref 0.44–1.00)
Glucose, Bld: 99 mg/dL (ref 65–99)
Potassium: 3.8 mmol/L (ref 3.5–5.1)
Sodium: 137 mmol/L (ref 135–145)
TOTAL PROTEIN: 6.9 g/dL (ref 6.5–8.1)
Total Bilirubin: 0.4 mg/dL (ref 0.3–1.2)

## 2016-09-01 LAB — URINE MICROSCOPIC-ADD ON

## 2016-09-01 LAB — DIFFERENTIAL
BASOS ABS: 0 10*3/uL (ref 0.0–0.1)
BASOS PCT: 0 %
EOS ABS: 0.2 10*3/uL (ref 0.0–0.7)
EOS PCT: 3 %
Lymphocytes Relative: 23 %
Lymphs Abs: 1.9 10*3/uL (ref 0.7–4.0)
Monocytes Absolute: 0.6 10*3/uL (ref 0.1–1.0)
Monocytes Relative: 8 %
NEUTROS PCT: 66 %
Neutro Abs: 5.5 10*3/uL (ref 1.7–7.7)

## 2016-09-01 LAB — CBC
HCT: 41.7 % (ref 36.0–46.0)
Hemoglobin: 13.7 g/dL (ref 12.0–15.0)
MCH: 28.6 pg (ref 26.0–34.0)
MCHC: 32.9 g/dL (ref 30.0–36.0)
MCV: 87.1 fL (ref 78.0–100.0)
PLATELETS: 301 10*3/uL (ref 150–400)
RBC: 4.79 MIL/uL (ref 3.87–5.11)
RDW: 13.9 % (ref 11.5–15.5)
WBC: 8.7 10*3/uL (ref 4.0–10.5)

## 2016-09-01 LAB — URINALYSIS, ROUTINE W REFLEX MICROSCOPIC
Bilirubin Urine: NEGATIVE
Glucose, UA: NEGATIVE mg/dL
Hgb urine dipstick: NEGATIVE
Ketones, ur: NEGATIVE mg/dL
NITRITE: NEGATIVE
PROTEIN: NEGATIVE mg/dL
Specific Gravity, Urine: 1.025 (ref 1.005–1.030)
pH: 6 (ref 5.0–8.0)

## 2016-09-01 LAB — LIPASE, BLOOD: LIPASE: 29 U/L (ref 11–51)

## 2016-09-01 LAB — I-STAT BETA HCG BLOOD, ED (MC, WL, AP ONLY)

## 2016-09-01 MED ORDER — ONDANSETRON HCL 4 MG/2ML IJ SOLN
4.0000 mg | Freq: Once | INTRAMUSCULAR | Status: AC
  Administered 2016-09-01: 4 mg via INTRAVENOUS
  Filled 2016-09-01: qty 2

## 2016-09-01 MED ORDER — RANITIDINE HCL 150 MG PO TABS: 150.0000 mg | ORAL_TABLET | Freq: Two times a day (BID) | ORAL | 0 refills | Status: DC

## 2016-09-01 MED ORDER — MORPHINE SULFATE (PF) 4 MG/ML IV SOLN
4.0000 mg | Freq: Once | INTRAVENOUS | Status: AC
  Administered 2016-09-01: 4 mg via INTRAVENOUS
  Filled 2016-09-01: qty 1

## 2016-09-01 MED ORDER — ONDANSETRON HCL 8 MG PO TABS: 8.0000 mg | ORAL_TABLET | Freq: Three times a day (TID) | ORAL | 0 refills | Status: DC | PRN

## 2016-09-01 MED ORDER — SODIUM CHLORIDE 0.9 % IV BOLUS (SEPSIS)
1000.0000 mL | Freq: Once | INTRAVENOUS | Status: AC
  Administered 2016-09-01: 1000 mL via INTRAVENOUS

## 2016-09-01 NOTE — Discharge Instructions (Signed)
Your symptoms are likely from a viral gastroenteritis or could be from gastritis or an ulcer. It could also be that you have some mild gallbladder dysfunction, but your labs and ultrasound today were reassuring aside from mild fatty liver, if you continue to have issues with upper abdominal pain/nausea that persist for some time then you may need an outpatient HIDA scan to further evaluate your gallbladder functioning; however, nothing further needs to be done emergently today.  You will need to take zantac as directed, and avoid spicy/fatty/acidic foods, avoid soda/coffee/tea/alcohol. Avoid laying down flat within 30 minutes of eating. Avoid fatty and fried foods, eat a low-fat diet. Avoid NSAIDs like ibuprofen/aleve/motrin/etc on an empty stomach. May consider using over the counter tums/maalox as needed for additional relief. Use zofran as directed as needed for nausea.  Stay well hydrated with small sips of fluids throughout the day. Use tylenol as needed for pain. Follow a BRAT (banana-rice-applesauce-toast) diet as described below for the next 24-48 hours. The 'BRAT' diet is suggested, then progress to diet as tolerated as symptoms abate. Call your regular doctor if bloody stools, persistent diarrhea, vomiting, fever or abdominal pain.  Follow up with your regular doctor in one week for ongoing evaluation of your symptoms. Return to the ER for changes or worsening symptoms.   Abdominal (belly) pain can be caused by many things. Your caregiver performed an examination and possibly ordered blood/urine tests and imaging (CT scan, x-rays, ultrasound). Many cases can be observed and treated at home after initial evaluation in the emergency department. Even though you are being discharged home, abdominal pain can be unpredictable. Therefore, you need a repeated exam if your pain does not resolve, returns, or worsens. Most patients with abdominal pain don't have to be admitted to the hospital or have surgery, but  serious problems like appendicitis and gallbladder attacks can start out as nonspecific pain. Many abdominal conditions cannot be diagnosed in one visit, so follow-up evaluations are very important. SEEK IMMEDIATE MEDICAL ATTENTION IF YOU DEVELOP ANY OF THE FOLLOWING SYMPTOMS: The pain does not go away or becomes severe.  A temperature above 101 develops.  Repeated vomiting occurs (multiple episodes).  The pain becomes localized to portions of the abdomen. The right side could possibly be appendicitis. In an adult, the left lower portion of the abdomen could be colitis or diverticulitis.  Blood is being passed in stools or vomit (bright red or black tarry stools).  Return also if you develop chest pain, difficulty breathing, dizziness or fainting, or become confused, poorly responsive, or inconsolable (young children). The constipation stays for more than 4 days.  There is belly (abdominal) or rectal pain.  You do not seem to be getting better.

## 2016-09-01 NOTE — ED Provider Notes (Signed)
[facility]-EMERGENCY DEPT Provider Note   CSN: [id number] Arrival date & time: 09/01/16  1223     History   Chief Complaint Chief Complaint  Patient presents with  . Abdominal Pain  . Emesis    HPI Charlotte [name] is a 20 y.o. female with a PMHx of asthma, hidradenitis suppurativa, and obesity, with a PSHx of c/s x1, who presents to the ED with complaints of epigastric pain that began approximately 3 days ago. She describes the pain is 8/10 constant "churning" epigastric pain that is nonradiating, worse with eating, and with no treatments tried prior to arrival. Associated symptoms include nausea, 2 episodes of nonbloody nonbilious emesis yesterday and one episode of nonbloody nonbilious emesis today that always occurs after eating, ate [employer] and tacos yesterday and vomited both up. She also reports 1-2 episodes daily of watery nonbloody diarrhea. Positive sick contacts, states her daughter was sick with nausea and vomiting just prior to onset of her symptoms.   She denies any fevers, chills, chest pain, shortness breath, hematemesis, melena, hematochezia, constipation, obstipation, dysuria, hematuria, vaginal bleeding or discharge, numbness, tingling, focal weakness, recent travel, suspicious food intake, antibiotic use, recent alcohol use, or chronic NSAID use. She reports she has had one C-section, no other abdominal surgeries. LMP was last month, unsure of exact date, states that she has irregular menses. She is sexually active with one female partner, unprotected.   The history is provided by the patient and medical records. No language interpreter was used.  Abdominal Pain   This is a new problem. The current episode started more than 2 days ago. The problem occurs constantly. The problem has not changed since onset.The pain is associated with eating and sick contacts. The pain is located in the epigastric region. Quality: "churning" The pain is at a severity of 8/10. The pain is  moderate. Associated symptoms include diarrhea, nausea and vomiting. Pertinent negatives include fever, flatus, hematochezia, melena, constipation, dysuria, hematuria, arthralgias and myalgias. The symptoms are aggravated by eating. Nothing relieves the symptoms.  Emesis   Associated symptoms include abdominal pain and diarrhea. Pertinent negatives include no arthralgias, no chills, no fever and no myalgias.    Past Medical History:  Diagnosis Date  . Asthma    given an inhaler 6th grade  . Obesity     Patient Active Problem List   Diagnosis Date Noted  . IUD check up 07/14/2015  . Obesity 10/18/2014  . Hidradenitis suppurativa 10/18/2014    Past Surgical History:  Procedure Laterality Date  . CESAREAN SECTION N/A 05/02/2015   Procedure: CESAREAN SECTION;  Surgeon: Tereso NewcomerUgonna A Anyanwu, MD;  Location: [facility];  Service: Obstetrics;  Laterality: N/A;    OB History    Gravida Para Term Preterm AB Living   1 1 1     1    SAB TAB Ectopic Multiple Live Births         0 1       Home Medications    Prior to Admission medications   Medication Sig Start Date End Date Taking? Authorizing Provider  NYSTATIN, TOPICAL, POWD Apply 1 application topically 3 (three) times daily. 09/01/15   Judeth HornErin Lawrence, NP    Family History Family History  Problem Relation Age of Onset  . Cancer Paternal Grandmother   . Diabetes Mother   . Diabetes Maternal Grandmother     Social History Social History  Substance Use Topics  . Smoking status: Former Smoker    Packs/day: 0.25    Types:  Cigars  . Smokeless tobacco: Never Used  . Alcohol use No     Allergies   Review of patient's allergies indicates no known allergies.   Review of Systems Review of Systems  Constitutional: Negative for chills and fever.  Respiratory: Negative for shortness of breath.   Cardiovascular: Negative for chest pain.  Gastrointestinal: Positive for abdominal pain, diarrhea, nausea and vomiting. Negative for anal  bleeding, blood in stool, constipation, flatus, hematochezia and melena.  Genitourinary: Negative for dysuria, hematuria, vaginal bleeding and vaginal discharge.  Musculoskeletal: Negative for arthralgias and myalgias.  Skin: Negative for color change.  Allergic/Immunologic: Negative for immunocompromised state.  Neurological: Negative for weakness and numbness.  Psychiatric/Behavioral: Negative for confusion.   10 Systems reviewed and are negative for acute change except as noted in the HPI.   Physical Exam Updated Vital Signs BP 112/59 (BP Location: Right Arm)   Pulse 101   Temp 98.1 F (36.7 C) (Oral)   Resp 18   Ht 5\' 6"  (1.676 m)   Wt 113.4 kg   SpO2 99%   BMI 40.35 kg/m   Physical Exam  Constitutional: She is oriented to person, place, and time. Vital signs are normal. She appears well-developed and well-nourished.  Non-toxic appearance. No distress.  Afebrile, nontoxic, NAD  HENT:  Head: Normocephalic and atraumatic.  Mouth/Throat: Oropharynx is clear and moist and mucous membranes are normal.  Eyes: Conjunctivae and EOM are normal. Right eye exhibits no discharge. Left eye exhibits no discharge.  Neck: Normal range of motion. Neck supple.  Cardiovascular: Normal rate, regular rhythm, normal heart sounds and intact distal pulses.  Exam reveals no gallop and no friction rub.   No murmur heard. HR 80s during exam  Pulmonary/Chest: Effort normal and breath sounds normal. No respiratory distress. She has no decreased breath sounds. She has no wheezes. She has no rhonchi. She has no rales.  Abdominal: Soft. Normal appearance and bowel sounds are normal. She exhibits no distension. There is tenderness in the right upper quadrant and epigastric area. There is positive Murphy's sign. There is no rigidity, no rebound, no guarding, no CVA tenderness and no tenderness at McBurney's point.  Soft, obese but without obvious distension, +BS throughout, with mild RUQ and epigastric TTP, no  r/g/r, +murphy's, neg mcburney's, no CVA TTP   Musculoskeletal: Normal range of motion.  Neurological: She is alert and oriented to person, place, and time. She has normal strength. No sensory deficit.  Skin: Skin is warm, dry and intact. No rash noted.  Psychiatric: She has a normal mood and affect.  Nursing note and vitals reviewed.    ED Treatments / Results  Labs (all labs ordered are listed, but only abnormal results are displayed) Labs Reviewed  LIPASE, BLOOD  COMPREHENSIVE METABOLIC PANEL  CBC  DIFFERENTIAL  URINALYSIS, ROUTINE W REFLEX MICROSCOPIC (NOT AT [facility])  I-STAT BETA HCG BLOOD, ED ([facility], [facility], [facility] ONLY)    EKG  EKG Interpretation None       Radiology US Abdomen Complete  Result Date: 09/01/2016 CLINICAL DATA:  Right upper quadrant epigastric abdominal pain with nausea and vomiting. EXAM: ABDOMEN ULTRASOUND COMPLETE COMPARISON:  None. FINDINGS: Gallbladder: No gallstones or wall thickening visualized. No sonographic Murphy sign noted by sonographer. Common bile duct: Diameter: 4 mm Liver: Diffuse increased echogenicity compatible with hepatic steatosis. No definite focal hepatic abnormality. Patent portal vein with normal hepatopetal flow. IVC: Limited assessment.  Obscured by bowel gas. Pancreas: Also obscured by bowel gas. Spleen: Size and appearance  within normal limits. Right Kidney: Length: 10.3 cm. Echogenicity within normal limits. No mass or hydronephrosis visualized. Left Kidney: Length: 11.1 cm. Echogenicity within normal limits. No mass or hydronephrosis visualized. Abdominal aorta: No aneurysm visualized. Other findings: None. IMPRESSION: Negative for gallstones or acute finding. No biliary dilatation Hepatic steatosis Limited assessment and visualization of the IVC and pancreas secondary to bowel gas. Electronically Signed   By: Judie Petit.  Shick M.D.   On: 09/01/2016 14:40    Procedures Procedures (including critical care time)  Medications Ordered in  ED Medications  sodium chloride 0.9 % bolus 1,000 mL (1,000 mLs Intravenous New Bag/Given 09/01/16 1323)  ondansetron (ZOFRAN) injection 4 mg (4 mg Intravenous Given 09/01/16 1322)  morphine 4 MG/ML injection 4 mg (4 mg Intravenous Given 09/01/16 1322)     Initial Impression / Assessment and Plan / ED Course  I have reviewed the triage vital signs and the nursing notes.  Pertinent labs & imaging results that were available during my care of the patient were reviewed by me and considered in my medical decision making (see chart for details).  Clinical Course    20 y.o. female here with epigastric abd pain/n/v/d x3 days. +Sick contacts, daughter recently ill with similar symptoms. N/V tends to happen after eating fatty/fried foods. On exam, RUQ/epigastric TTP, nonperitoneal, +murphy's, no lower abd tenderness. No pelvic complaints, doubt need for pelvic exam. Will obtain abd u/s to eval for biliary tree/gallbladder etiology. Labs drawn but not yet in process, will await CBC w/diff, CMP, lipase, betaHCG, and will await U/A. Will give morphine, zofran, and fluids, and reassess after abd U/S and work up done shortly.   3:05 PM CBC w/diff WNL. CMP WNL. Lipase WNL. Istat betaHCG neg. U/A not yet done, pt will attempt to provide sample now. U/S with some mild hepatic steatosis, no acute gallbladder pathology or gallstones, no biliary dilation; unable to assess pancreas and IVC sufficiently due to bowel gas obscuring visualization. Pt feeling better with nausea, will PO challenge, but states pain is not significantly improved; doesn't want anything more for pain, however, states that she would rather not get any more meds for this at this time. Will reassess after U/A results, and PO challenged.  3:48 PM Pt tolerating PO well, feeling well. U/A just now obtained and sent, will await results. Will reassess shortly, once results return.   3:54 PM Patient care to be resumed by Audry Pili, PA-C at shift  change sign-out. Patient history has been discussed with midlevel resuming care. U/A pending still. Please see their notes for further documentation of pending results and dispo/care. Pt stable at sign-out and updated on transfer of care.   Plan: assuming U/A is negative, then I discussed with pt that her symptoms are likely related to gastritis vs viral gastroenteritis vs possible gallbladder dysfunction given that her diet is mostly consistent of fatty/fried foods, discussed that if symptoms persist past what would be expected for a viral illness, that she may warrant further outpatient work up such as a HIDA scan, but doubt need for further emergent work up today given neg U/S and normal LFTs. Will send home with zofran and zantac to help with symptoms, BRAT diet discussed, diet modifications for gallbladder etiologies discussed. Tylenol/tums/maalox for symptoms, +/- ibuprofen but not on an empty stomach and only if absolutely necessary.  F/up with PCP in 1wk for recheck of symptoms and ongoing management.   IF U/A is abnormal then Joselyn Glassman will change plan accordingly. Please see his  notes for that documentation. Pt stable at time of sign out.   Final Clinical Impressions(s) / ED Diagnoses   Final diagnoses:  Nausea vomiting and diarrhea  Epigastric abdominal pain  Viral gastroenteritis  Gastritis without bleeding, unspecified chronicity, unspecified gastritis type  Hepatic steatosis    New Prescriptions New Prescriptions   ONDANSETRON (ZOFRAN) 8 MG TABLET    Take 1 tablet (8 mg total) by mouth every 8 (eight) hours as needed for nausea or vomiting.   RANITIDINE (ZANTAC) 150 MG TABLET    Take 1 tablet (150 mg total) by mouth 2 (two) times daily.     Izaias Krupka Camprubi-Soms, PA-C 09/01/16 1556    Jacalyn Lefevre, MD 09/02/16 0700

## 2016-09-01 NOTE — ED Notes (Signed)
Pt given gingerale for PO challenge 

## 2016-09-01 NOTE — ED Triage Notes (Signed)
Pt reports sharp mid abd pain since Tuesday with n/v/d.

## 2016-10-04 ENCOUNTER — Encounter (HOSPITAL_COMMUNITY): Payer: Self-pay | Admitting: Emergency Medicine

## 2016-10-04 ENCOUNTER — Emergency Department (HOSPITAL_COMMUNITY)
Admission: EM | Admit: 2016-10-04 | Discharge: 2016-10-05 | Disposition: A | Discharge status: Other Acute Inpatient Hospital | Payer: Medicaid Other | Attending: Emergency Medicine | Admitting: Emergency Medicine

## 2016-10-04 DIAGNOSIS — Z7289 Other problems related to lifestyle: Secondary | ICD-10-CM

## 2016-10-04 DIAGNOSIS — Y939 Activity, unspecified: Secondary | ICD-10-CM | POA: Insufficient documentation

## 2016-10-04 DIAGNOSIS — Y929 Unspecified place or not applicable: Secondary | ICD-10-CM | POA: Insufficient documentation

## 2016-10-04 DIAGNOSIS — Z87891 Personal history of nicotine dependence: Secondary | ICD-10-CM | POA: Insufficient documentation

## 2016-10-04 DIAGNOSIS — J45909 Unspecified asthma, uncomplicated: Secondary | ICD-10-CM | POA: Insufficient documentation

## 2016-10-04 DIAGNOSIS — F332 Major depressive disorder, recurrent severe without psychotic features: Secondary | ICD-10-CM | POA: Diagnosis present

## 2016-10-04 DIAGNOSIS — Y999 Unspecified external cause status: Secondary | ICD-10-CM | POA: Insufficient documentation

## 2016-10-04 DIAGNOSIS — Z79899 Other long term (current) drug therapy: Secondary | ICD-10-CM | POA: Insufficient documentation

## 2016-10-04 DIAGNOSIS — X788XXA Intentional self-harm by other sharp object, initial encounter: Secondary | ICD-10-CM | POA: Insufficient documentation

## 2016-10-04 DIAGNOSIS — T424X2A Poisoning by benzodiazepines, intentional self-harm, initial encounter: Secondary | ICD-10-CM | POA: Insufficient documentation

## 2016-10-04 DIAGNOSIS — T50902A Poisoning by unspecified drugs, medicaments and biological substances, intentional self-harm, initial encounter: Secondary | ICD-10-CM

## 2016-10-04 LAB — CBC
HEMATOCRIT: 46.1 % — AB (ref 36.0–46.0)
HEMOGLOBIN: 15.1 g/dL — AB (ref 12.0–15.0)
MCH: 28.9 pg (ref 26.0–34.0)
MCHC: 32.8 g/dL (ref 30.0–36.0)
MCV: 88.1 fL (ref 78.0–100.0)
Platelets: 323 10*3/uL (ref 150–400)
RBC: 5.23 MIL/uL — AB (ref 3.87–5.11)
RDW: 13.6 % (ref 11.5–15.5)
WBC: 10.5 10*3/uL (ref 4.0–10.5)

## 2016-10-04 LAB — SALICYLATE LEVEL

## 2016-10-04 LAB — COMPREHENSIVE METABOLIC PANEL
ALK PHOS: 91 U/L (ref 38–126)
ALT: 38 U/L (ref 14–54)
AST: 32 U/L (ref 15–41)
Albumin: 4.5 g/dL (ref 3.5–5.0)
Anion gap: 7 (ref 5–15)
BUN: 5 mg/dL — AB (ref 6–20)
CALCIUM: 9.5 mg/dL (ref 8.9–10.3)
CHLORIDE: 105 mmol/L (ref 101–111)
CO2: 26 mmol/L (ref 22–32)
CREATININE: 0.72 mg/dL (ref 0.44–1.00)
GFR calc non Af Amer: >60 mL/min (ref >60)
GLUCOSE: 99 mg/dL (ref 65–99)
Potassium: 3.9 mmol/L (ref 3.5–5.1)
SODIUM: 138 mmol/L (ref 135–145)
Total Bilirubin: 0.4 mg/dL (ref 0.3–1.2)
Total Protein: 7.6 g/dL (ref 6.5–8.1)

## 2016-10-04 LAB — CBG MONITORING, ED
Glucose-Capillary: 94 mg/dL (ref 65–99)
Glucose-Capillary: 80 mg/dL (ref 65–99)

## 2016-10-04 LAB — RAPID URINE DRUG SCREEN, HOSP PERFORMED
AMPHETAMINES: NOT DETECTED
BARBITURATES: NOT DETECTED
Benzodiazepines: POSITIVE — AB
Cocaine: NOT DETECTED
Opiates: NOT DETECTED
TETRAHYDROCANNABINOL: POSITIVE — AB

## 2016-10-04 LAB — ACETAMINOPHEN LEVEL: Acetaminophen (Tylenol), Serum: <10 ug/mL — ABNORMAL LOW (ref 10–30)

## 2016-10-04 LAB — ETHANOL: Alcohol, Ethyl (B): <5 mg/dL (ref <5)

## 2016-10-04 LAB — I-STAT BETA HCG BLOOD, ED (MC, WL, AP ONLY)

## 2016-10-04 MED ORDER — BACITRACIN ZINC 500 UNIT/GM EX OINT
TOPICAL_OINTMENT | Freq: Two times a day (BID) | CUTANEOUS | Status: DC
  Administered 2016-10-04: 22:00:00 via TOPICAL

## 2016-10-04 NOTE — ED Notes (Signed)
Meal tray brought to pt. Pt began eating, but then felt nauseous and stopped eating. Pt fell back asleep.

## 2016-10-04 NOTE — ED Notes (Signed)
Judeth CornfieldStephanie [facility] called and talked to Home DepotKevin RN and was given an updated. Pt is stable, VSS, pt still slightly difficulty to arouse at times but has ambulated to the room and when awake and is alert and oriented x 4.

## 2016-10-04 NOTE — ED Notes (Signed)
Ice water given to pt per Caryn BeeKevin Banker(RN)
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
## 2016-10-04 NOTE — ED Notes (Signed)
Gauze applied to lacerations on R leg

## 2016-10-04 NOTE — ED Notes (Signed)
Spoke to EdwardsStephanie at poison control.  Recommends hydration, EKG and tylenol level, due to unknown certainty of pt's OD substance.

## 2016-10-04 NOTE — ED Notes (Signed)
Secured pts room with zip ties with the assistance of Onalee HuaDavid (EMT)

## 2016-10-04 NOTE — ED Notes (Signed)
EKG given to Dr. Knott 

## 2016-10-04 NOTE — ED Provider Notes (Signed)
[facility] Provider Note   CSN: [id number] Arrival date & time: 10/04/16  1614     History   Chief Complaint Chief Complaint  Patient presents with  . Suicidal  . Drug Overdose    HPI Charlotte [name] is a 20 y.o. female.  The history is provided by the patient and a friend.  Mental Health Problem  Presenting symptoms: depression, self-mutilation and suicidal thoughts   Presenting symptoms: no homicidal ideas   Degree of incapacity (severity):  Moderate Onset quality:  Sudden Duration:  1 day Timing:  Constant Progression:  Unchanged Chronicity:  New Context: alcohol use and drug abuse   Treatment compliance:  Untreated Relieved by:  Nothing Worsened by:  Nothing Ineffective treatments: cutting herself. Associated symptoms: anhedonia, hypersomnia and poor judgment     Past Medical History:  Diagnosis Date  . Asthma    given an inhaler 6th grade  . Obesity     Patient Active Problem List   Diagnosis Date Noted  . IUD check up 07/14/2015  . Obesity 10/18/2014  . Hidradenitis suppurativa 10/18/2014    Past Surgical History:  Procedure Laterality Date  . CESAREAN SECTION N/A 05/02/2015   Procedure: CESAREAN SECTION;  Surgeon: Tereso NewcomerUgonna A Anyanwu, MD;  Location: [facility];  Service: Obstetrics;  Laterality: N/A;    OB History    Gravida Para Term Preterm AB Living   1 1 1     1    SAB TAB Ectopic Multiple Live Births         0 1       Home Medications    Prior to Admission medications   Medication Sig Start Date End Date Taking? Authorizing Provider  NYSTATIN, TOPICAL, POWD Apply 1 application topically 3 (three) times daily. Patient not taking: Reported on 10/04/2016 09/01/15   Judeth HornErin Lawrence, NP  ondansetron (ZOFRAN) 8 MG tablet Take 1 tablet (8 mg total) by mouth every 8 (eight) hours as needed for nausea or vomiting. Patient not taking: Reported on 10/04/2016 09/01/16   Mercedes Camprubi-Soms, PA-C  ranitidine (ZANTAC) 150 MG tablet Take 1  tablet (150 mg total) by mouth 2 (two) times daily. Patient not taking: Reported on 10/04/2016 09/01/16   Mercedes Camprubi-Soms, PA-C    Family History Family History  Problem Relation Age of Onset  . Cancer Paternal Grandmother   . Diabetes Mother   . Diabetes Maternal Grandmother     Social History Social History  Substance Use Topics  . Smoking status: Former Smoker    Packs/day: 0.25    Types: Cigars  . Smokeless tobacco: Never Used  . Alcohol use No     Allergies   Patient has no known allergies.   Review of Systems Review of Systems  Psychiatric/Behavioral: Positive for self-injury and suicidal ideas. Negative for homicidal ideas.  All other systems reviewed and are negative.    Physical Exam Updated Vital Signs BP 116/74 (BP Location: Left Arm)   Pulse 87   Temp 98.5 F (36.9 C) (Oral)   Resp 16   Ht 5\' 6"  (1.676 m)   Wt 266 lb 11.2 oz (121 kg)   SpO2 100%   BMI 43.05 kg/m   Physical Exam  Constitutional: She is oriented to person, place, and time. She appears well-developed and well-nourished. No distress.  HENT:  Head: Normocephalic.  Nose: Nose normal.  Eyes: Conjunctivae are normal.  Neck: Neck supple. No tracheal deviation present.  Cardiovascular: Normal rate, regular rhythm and normal heart sounds.  Pulmonary/Chest: Effort normal and breath sounds normal. No respiratory distress.  Abdominal: Soft. She exhibits no distension.  Neurological: She is alert and oriented to person, place, and time.  Skin: Skin is warm and dry.  Psychiatric: Her speech is delayed. She is slowed. She exhibits a depressed mood.  Vitals reviewed.    ED Treatments / Results  Labs (all labs ordered are listed, but only abnormal results are displayed) Labs Reviewed  COMPREHENSIVE METABOLIC PANEL - Abnormal; Notable for the following:       Result Value   BUN 5 (*)    All other components within normal limits  CBC - Abnormal; Notable for the following:    RBC  5.23 (*)    Hemoglobin 15.1 (*)    HCT 46.1 (*)    All other components within normal limits  ETHANOL  SALICYLATE LEVEL  ACETAMINOPHEN LEVEL  RAPID URINE DRUG SCREEN, HOSP PERFORMED  CBG MONITORING, ED  I-STAT BETA HCG BLOOD, ED ([facility], [facility], [facility] ONLY)  CBG MONITORING, ED    EKG  EKG Interpretation  Date/Time:  Thursday October 04 2016 18:44:35 EST Ventricular Rate:  74 PR Interval:  130 QRS Duration: 85 QT Interval:  386 QTC Calculation: 429 R Axis:   64 Text Interpretation:  Sinus rhythm Normal ECG No significant change since last tracing Confirmed by Jamarien Rodkey MD, Ayra Hodgdon [id number]) on 10/04/2016 6:49:58 PM       Radiology No results found.  Procedures Procedures (including critical care time)  Medications Ordered in ED Medications  bacitracin ointment (not administered)     Initial Impression / Assessment and Plan / ED Course  I have reviewed the triage vital signs and the nursing notes.  Pertinent labs & imaging results that were available during my care of the patient were reviewed by me and considered in my medical decision making (see chart for details).  Clinical Course     20 year old female presents with self-mutilation after drinking alcohol and taking up to 6 Xanax bar tablets last night at 2 AM. Poison control was contacted and recommended supportive care measures. The patient was able to hold a conversation and states that she did this for recreational purposes" to try to fit in" but has been increasingly depressed and took a razor superficially cutting her right thigh and lower leg. No lacerations appear to violate through the dermis or will require repair. Will cover with antibiotic ointment to prevent secondary infection. Due to level of erratic behavior and concerns for safety TTS was consulted to evaluate the patient for potential placement if she qualifies for inpatient stay.  MEDICALLY CLEAR FOR TRANSFER OR PSYCHIATRIC ADMISSION.   Final Clinical  Impressions(s) / ED Diagnoses   Final diagnoses:  Intentional drug overdose, initial encounter [facility](HCC)  Self-mutilation    New Prescriptions New Prescriptions   No medications on file     Lyndal Pulleyaniel Shaymus Eveleth, MD 10/04/16 1857

## 2016-10-04 NOTE — ED Notes (Addendum)
Boyfriend at bedside

## 2016-10-04 NOTE — ED Notes (Signed)
Pts CBG was 80 notified Caryn BeeKevin Banker(RN)
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
## 2016-10-05 ENCOUNTER — Inpatient Hospital Stay (HOSPITAL_COMMUNITY)
Admission: AD | Admit: 2016-10-05 | Discharge: 2016-10-08 | DRG: 885 | Disposition: A | Discharge status: Home / Self Care | Payer: Federal, State, Local not specified - Other | Source: Intra-hospital | Attending: Emergency Medicine | Admitting: Emergency Medicine

## 2016-10-05 ENCOUNTER — Encounter (HOSPITAL_COMMUNITY): Payer: Self-pay

## 2016-10-05 DIAGNOSIS — R55 Syncope and collapse: Secondary | ICD-10-CM | POA: Diagnosis present

## 2016-10-05 DIAGNOSIS — J45909 Unspecified asthma, uncomplicated: Secondary | ICD-10-CM | POA: Diagnosis present

## 2016-10-05 DIAGNOSIS — G4700 Insomnia, unspecified: Secondary | ICD-10-CM | POA: Diagnosis present

## 2016-10-05 DIAGNOSIS — Z808 Family history of malignant neoplasm of other organs or systems: Secondary | ICD-10-CM | POA: Diagnosis not present

## 2016-10-05 DIAGNOSIS — Z6841 Body Mass Index (BMI) 40.0 and over, adult: Secondary | ICD-10-CM | POA: Diagnosis not present

## 2016-10-05 DIAGNOSIS — F332 Major depressive disorder, recurrent severe without psychotic features: Secondary | ICD-10-CM | POA: Diagnosis present

## 2016-10-05 DIAGNOSIS — Z833 Family history of diabetes mellitus: Secondary | ICD-10-CM | POA: Diagnosis not present

## 2016-10-05 DIAGNOSIS — E669 Obesity, unspecified: Secondary | ICD-10-CM | POA: Diagnosis present

## 2016-10-05 DIAGNOSIS — F411 Generalized anxiety disorder: Secondary | ICD-10-CM | POA: Diagnosis present

## 2016-10-05 DIAGNOSIS — Z809 Family history of malignant neoplasm, unspecified: Secondary | ICD-10-CM | POA: Diagnosis not present

## 2016-10-05 DIAGNOSIS — F410 Panic disorder [episodic paroxysmal anxiety] without agoraphobia: Secondary | ICD-10-CM | POA: Diagnosis present

## 2016-10-05 DIAGNOSIS — Z87891 Personal history of nicotine dependence: Secondary | ICD-10-CM

## 2016-10-05 LAB — CBC WITH DIFFERENTIAL/PLATELET
BASOS PCT: 0 %
Basophils Absolute: 0 10*3/uL (ref 0.0–0.1)
EOS ABS: 0.2 10*3/uL (ref 0.0–0.7)
Eosinophils Relative: 3 %
HEMATOCRIT: 45.1 % (ref 36.0–46.0)
HEMOGLOBIN: 14.5 g/dL (ref 12.0–15.0)
LYMPHS ABS: 3.2 10*3/uL (ref 0.7–4.0)
Lymphocytes Relative: 35 %
MCH: 28.9 pg (ref 26.0–34.0)
MCHC: 32.2 g/dL (ref 30.0–36.0)
MCV: 90 fL (ref 78.0–100.0)
MONOS PCT: 6 %
Monocytes Absolute: 0.6 10*3/uL (ref 0.1–1.0)
NEUTROS ABS: 5.1 10*3/uL (ref 1.7–7.7)
NEUTROS PCT: 56 %
Platelets: 301 10*3/uL (ref 150–400)
RBC: 5.01 MIL/uL (ref 3.87–5.11)
RDW: 14 % (ref 11.5–15.5)
WBC: 9.1 10*3/uL (ref 4.0–10.5)

## 2016-10-05 LAB — GLUCOSE, CAPILLARY: GLUCOSE-CAPILLARY: 93 mg/dL (ref 65–99)

## 2016-10-05 LAB — BASIC METABOLIC PANEL
Anion gap: 8 (ref 5–15)
BUN: 10 mg/dL (ref 6–20)
CHLORIDE: 106 mmol/L (ref 101–111)
CO2: 26 mmol/L (ref 22–32)
Calcium: 9.3 mg/dL (ref 8.9–10.3)
Creatinine, Ser: 0.76 mg/dL (ref 0.44–1.00)
GFR calc non Af Amer: >60 mL/min (ref >60)
Glucose, Bld: 93 mg/dL (ref 65–99)
POTASSIUM: 4 mmol/L (ref 3.5–5.1)
SODIUM: 140 mmol/L (ref 135–145)

## 2016-10-05 MED ORDER — TRAZODONE HCL 50 MG PO TABS
50.0000 mg | ORAL_TABLET | Freq: Every evening | ORAL | Status: DC | PRN
  Administered 2016-10-05 – 2016-10-07 (×3): 50 mg via ORAL
  Filled 2016-10-05 (×3): qty 1
  Filled 2016-10-05: qty 7

## 2016-10-05 MED ORDER — HYDROXYZINE HCL 25 MG PO TABS
25.0000 mg | ORAL_TABLET | Freq: Four times a day (QID) | ORAL | Status: DC | PRN
  Administered 2016-10-06 – 2016-10-07 (×4): 25 mg via ORAL
  Filled 2016-10-05 (×2): qty 1
  Filled 2016-10-05: qty 10
  Filled 2016-10-05 (×2): qty 1

## 2016-10-05 MED ORDER — ALUM & MAG HYDROXIDE-SIMETH 200-200-20 MG/5ML PO SUSP: 30.0000 mL | ORAL | Status: DC | PRN

## 2016-10-05 MED ORDER — ACETAMINOPHEN 325 MG PO TABS
650.0000 mg | ORAL_TABLET | Freq: Four times a day (QID) | ORAL | Status: DC | PRN
  Administered 2016-10-07: 650 mg via ORAL
  Filled 2016-10-05: qty 2

## 2016-10-05 MED ORDER — LORAZEPAM 1 MG PO TABS
1.0000 mg | ORAL_TABLET | Freq: Once | ORAL | Status: AC
  Administered 2016-10-05: 1 mg via ORAL
  Filled 2016-10-05: qty 1

## 2016-10-05 MED ORDER — LORAZEPAM 2 MG/ML IJ SOLN: 1.0000 mg | Freq: Once | INTRAMUSCULAR | Status: AC

## 2016-10-05 MED ORDER — BACITRACIN ZINC 500 UNIT/GM EX OINT
TOPICAL_OINTMENT | Freq: Two times a day (BID) | CUTANEOUS | Status: DC
  Administered 2016-10-05: 1 via TOPICAL
  Filled 2016-10-05: qty 28.35

## 2016-10-05 MED ORDER — MAGNESIUM HYDROXIDE 400 MG/5ML PO SUSP: 30.0000 mL | Freq: Every day | ORAL | Status: DC | PRN

## 2016-10-05 NOTE — ED Provider Notes (Signed)
[facility]-EMERGENCY DEPT Provider Note   CSN: [id number] Arrival date & time: 10/05/16  1148     History   Chief Complaint No chief complaint on file.   HPI Larita FifeKimberly Lafortune is a 20 y.o. female.  [facility] with a chief complaint of syncopal episode. She reportedly passed out while sitting in a chair and became unresponsive to staff.  EMS was called and she was brought to the ED.  There was no reported seizure like activity.  Patient denies being in any pain.  She states that she feels intoxicated.  She denies any other associated symptoms.  There are no modifying factors.   The history is provided by the patient. No language interpreter was used.    Past Medical History:  Diagnosis Date  . Asthma    given an inhaler 6th grade  . Obesity     Patient Active Problem List   Diagnosis Date Noted  . 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 IUD check up 07/14/2015  . Obesity 10/18/2014  . Hidradenitis suppurativa 10/18/2014    Past Surgical History:  Procedure Laterality Date  . CESAREAN SECTION N/A 05/02/2015   Procedure: CESAREAN SECTION;  Surgeon: Tereso NewcomerUgonna A Anyanwu, MD;  Location: [facility];  Service: Obstetrics;  Laterality: N/A;    OB History    Gravida Para Term Preterm AB Living   1 1 1     1    SAB TAB Ectopic Multiple Live Births         0 1       Home Medications    Prior to Admission medications   Medication Sig Start Date End Date Taking? Authorizing Provider  NYSTATIN, TOPICAL, POWD Apply 1 application topically 3 (three) times daily. Patient not taking: Reported on 10/05/2016 09/01/15   Judeth HornErin Lawrence, NP  ondansetron (ZOFRAN) 8 MG tablet Take 1 tablet (8 mg total) by mouth every 8 (eight) hours as needed for nausea or vomiting. Patient not taking: Reported on 10/05/2016 09/01/16   Mercedes Camprubi-Soms, PA-C  ranitidine (ZANTAC) 150 MG tablet Take 1 tablet (150 mg  total) by mouth 2 (two) times daily. Patient not taking: Reported on 10/05/2016 09/01/16   Mercedes Camprubi-Soms, PA-C    Family History Family History  Problem Relation Age of Onset  . Cancer Paternal Grandmother   . Diabetes Mother   . Diabetes Maternal Grandmother     Social History Social History  Substance Use Topics  . Smoking status: Former Smoker    Packs/day: 0.25    Types: Cigars  . Smokeless tobacco: Never Used  . Alcohol use No     Allergies   Patient has no known allergies.   Review of Systems Review of Systems  Neurological: Positive for syncope.  All other systems reviewed and are negative.    Physical Exam Updated Vital Signs BP (!) 127/111 (BP Location: Right Arm)   Pulse 77   Temp 98.3 F (36.8 C) (Oral)   Resp 16   Ht 5\' 6"  (1.676 m)   Wt 126.1 kg   SpO2 100%   BMI 44.87 kg/m   Physical Exam  Constitutional: She is oriented to person, place, and time. She appears well-developed and well-nourished.  HENT:  Head: Normocephalic and atraumatic.  Eyes: Conjunctivae and EOM are normal. Pupils are equal, round, and reactive to light.  Neck: Normal range of motion. Neck supple.  Cardiovascular: Normal rate and regular rhythm.  Exam  reveals no gallop and no friction rub.   No murmur heard. Pulmonary/Chest: Effort normal and breath sounds normal. No respiratory distress. She has no wheezes. She has no rales. She exhibits no tenderness.  Abdominal: Soft. Bowel sounds are normal. She exhibits no distension and no mass. There is no tenderness. There is no rebound and no guarding.  Musculoskeletal: Normal range of motion. She exhibits no edema or tenderness.  Neurological: She is alert and oriented to person, place, and time.  Skin: Skin is warm and dry.  Psychiatric: She has a normal mood and affect. Her behavior is normal. Judgment and thought content normal.  Nursing note and vitals reviewed.    ED Treatments / Results  Labs (all labs  ordered are listed, but only abnormal results are displayed) Labs Reviewed  GLUCOSE, CAPILLARY  CBC WITH DIFFERENTIAL/PLATELET  BASIC METABOLIC PANEL    EKG  EKG Interpretation  Date/Time:  Friday October 05 2016 13:01:56 EST Ventricular Rate:  66 PR Interval:    QRS Duration: 88 QT Interval:  407 QTC Calculation: 427 R Axis:   40 Text Interpretation:  Sinus arrhythmia No significant change since last tracing Confirmed by Ethelda ChickJACUBOWITZ  MD, SAM [phone]([id number]) on 10/05/2016 1:20:28 PM       Radiology No results found.  Procedures Procedures (including critical care time)  Medications Ordered in ED Medications  acetaminophen (TYLENOL) tablet 650 mg (not administered)  alum & mag hydroxide-simeth (MAALOX/MYLANTA) 200-200-20 MG/5ML suspension 30 mL (not administered)  hydrOXYzine (ATARAX/VISTARIL) tablet 25 mg (not administered)  magnesium hydroxide (MILK OF MAGNESIA) suspension 30 mL (not administered)  traZODone (DESYREL) tablet 50 mg (not administered)  bacitracin ointment (1 application Topical Given 10/05/16 0859)     Initial Impression / Assessment and Plan / ED Course  I have reviewed the triage vital signs and the nursing notes.  Pertinent labs & imaging results that were available during my care of the patient were reviewed by me and considered in my medical decision making (see chart for details).  Clinical Course     Patient with reported syncopal episode. Normal blood glucose.  H/H is stable.  Electrolytes are normal.  EKG is baseline.  Preg test from yesterday is negative.  Feel that patient is stable [facility].  Final Clinical Impressions(s) / ED Diagnoses   Final diagnoses:  Syncope and collapse    New Prescriptions New Prescriptions   No medications on file        Roxy HorsemanRobert Wynonia Medero, PA-C 10/05/16 1521    Doug SouSam Jacubowitz, MD 10/05/16 1732

## 2016-10-05 NOTE — ED Notes (Signed)
[facility] AA[facility]. PT IN NO APPARENT DISTRESS OR PAIN. THE OPPORTUNITY TO ASK QUESTIONS WAS PROVIDED.

## 2016-10-05 NOTE — ED Notes (Signed)
[facility] ARRIVED TO TRANSPORT PT BACK TO [facility].
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
## 2016-10-05 NOTE — ED Notes (Signed)
Patient states chest hurting and feeling dizzy upon sitting up. Also states she wants to see her dad and be able to call her dad, and that "[facility]" [facility]
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
## 2016-10-05 NOTE — [facility] Suicide Risk Assessment (Signed)
[facility] INPATIENT:  Family/Significant Other Suicide Prevention Education  Suicide Prevention Education:  Education Completed; Anselm PancoastDorfirio Leggio, Pt's father [phone], has been identified by the patient as the family member/significant other with whom the patient will be residing, and identified as the person(s) who will aid the patient in the event of a mental health crisis (suicidal ideations/suicide attempt).  With written consent from the patient, the family member/significant other has been provided the following suicide prevention education, prior to the and/or following the discharge of the patient.  The suicide prevention education provided includes the following:  Suicide risk factors  Suicide prevention and interventions  National Suicide Hotline telephone number  [facility] assessment telephone number  [facility] Emergency Assistance 911  [facility] and/or [facility]idential [facility] telephone number  Request made of family/significant other to:  Remove weapons (e.g., guns, rifles, knives), all items previously/currently identified as safety concern.    Remove drugs/medications (over-the-counter, prescriptions, illicit drugs), all items previously/currently identified as a safety concern.  The family member/significant other verbalizes understanding of the suicide prevention education information provided.  The family member/significant other agrees to remove the items of safety concern listed above.  Completed with interpreter, Eulis FosterAlberto- interpreter ID [id number].  Verdene LennertLauren C Gorge Almanza 10/05/2016, 11:26 AM
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
## 2016-10-05 NOTE — Progress Notes (Signed)
Recreation Therapy Notes  Date: 10/05/16 Time: 0930 Location: 300 Hall Dayroom  Group Topic: Stress Management  Goal Area(s) Addresses:  Patient will verbalize importance of using healthy stress management.  Patient will identify positive emotions associated with healthy stress management.   Intervention: Calm App  Activity :  Body Scan Meditation.  LRT introduced the stress management technique of meditation to the group.  LRT played a body scan meditation from the Calm App to allow patients to take notice of the stress and tension the body can hold.  Patients were to follow along with the app to participate in the activity to relieve their bodies of any tension or stress.  Education:  Stress Management, Discharge Planning.   Education Outcome: Acknowledges edcuation/In group clarification offered/Needs additional education  Clinical Observations/Feedback: Pt did not attend group.   Caroll RancherMarjette Kasy Iannacone, LRT/CTRS         Caroll RancherLindsay, Veatrice Eckstein A 10/05/2016 11:38 AM
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
## 2016-10-05 NOTE — Progress Notes (Signed)
Per nursing, patient was found slumped in chair.  This writer witnessed her disoriented initially, unable to speak.  She was unable to reach her children via the phone and this may have brought on a anxiety attack.  She felt like she needed to vomit but was unable to.  Checked vitals 134/87 pulse 80 and 100% on room air.  EMS called.  Care transferred.

## 2016-10-05 NOTE — ED Notes (Signed)
Pt belongings were sent home with boyfriend yesterday

## 2016-10-05 NOTE — ED Notes (Signed)
Bed: WA21 Expected date: 10/05/16 Expected time: 11:37 AM Means of arrival: Ambulance Comments[facility]
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
## 2016-10-06 DIAGNOSIS — Z808 Family history of malignant neoplasm of other organs or systems: Secondary | ICD-10-CM

## 2016-10-06 DIAGNOSIS — F332 Major depressive disorder, recurrent severe without psychotic features: Principal | ICD-10-CM

## 2016-10-06 DIAGNOSIS — Z833 Family history of diabetes mellitus: Secondary | ICD-10-CM

## 2016-10-06 MED ORDER — FLUOXETINE HCL 10 MG PO CAPS
10.0000 mg | ORAL_CAPSULE | Freq: Every day | ORAL | Status: DC
  Administered 2016-10-06 – 2016-10-08 (×3): 10 mg via ORAL
  Filled 2016-10-06 (×4): qty 1
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
## 2016-10-06 NOTE — [facility] Group Notes (Signed)
Date:  10/06/2016  Time:   1000           Goals Group:  Type of Therapy:  Nurse Education  /  Goals Group:  The group focuses on teaching patients how to identify daily goals that will help them in getting their needs met.   Participation Level:  Did Not Attend  Participation Quality:  N/A  Affect:  N/A  Cognitive: N/A   Insight:  N/A  Engagement in Group:  N/a  Modes of Intervention:  N/a  Summary of Progress/Problems:  [name], Charlotte Lynn 10/06/2016, 4:26 PM 

## 2016-10-06 NOTE — Progress Notes (Signed)
Patient inform staff her bed is moving. Staff assured pt bed is not moving. A second staff member came to assured pt bed is not moving. Pt talked AC about the bed moving. AC assured the pt the bed is not moving. Staff inform pt she can move to the other bed #1 in the room. Pt lay in the bed. Pt said bed #1 is not moving.

## 2016-10-06 NOTE — Progress Notes (Signed)
Pt denies SI/HI and verbally contracts for safety.  PRN medication was administered for sleep and anxiety.  Pt is safe on the unit.  Will continue to monitor and assess.
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
## 2016-10-06 NOTE — [facility] Group Notes (Signed)
Pt attended group. Rules of the of the unit were explained. Pt was asked how was your day on a scale of 1-10. Pt stated it was a 5. Pt stated it was because she has to stay longer and she has no roommate which causes her anxiety to get worse.
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
## 2016-10-06 NOTE — Progress Notes (Signed)
D   Pt is pleasant and cooperative    She is compliant with treatment and interacts well with others    She continues to request a roommate while she is here A    Verbal support given    Medications administered and effectiveness monitored    Q 15 min checks R   Pt is safe at present and receptive to verbal support
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
## 2016-10-06 NOTE — [facility] Suicide Risk Assessment (Addendum)
[facility]   Nursing information obtained from:    Demographic factors:    Current Mental Status:    Loss Factors:    Historical Factors:    Risk Reduction Factors:     Total Time spent with patient: 45 minutes Principal Problem: MDD (major depressive disorder), recurrent severe, without psychosis (HCC) Diagnosis:   Patient Active Problem List   Diagnosis Date Noted  . MDD (major depressive disorder), recurrent severe, without psychosis (HCC) [F33.2] 10/04/2016  . IUD check up [Z30.431] 07/14/2015  . Obesity [E66.9] 10/18/2014  . Hidradenitis suppurativa [L73.2] 10/18/2014   Subjective Data: Patient is a 20 year old female who was admitted due to severe depression and feeling overwhelmed as not getting enough help.  Patient has a [id number]-year-old daughter and experiencing postpartum depression but are not able to get help.  She admitted having suicidal thoughts and then she went outside and was offered 6 pills Xanax from friend which she took and then she passed out.  Patient also made scratches on her legs.  Patient admitted at that time she was feeling careless and hopeless and does not want to live.  Patient also admitted smoking are want any her U tox was positive for cannabis.  Upon arrival to unit she again pass out and sent to the emergency room and then [facility].  Patient continues to minimize her symptoms.  She does not want to stay in the hospital and minimizes her symptoms.  Please see history and physical for more details.  Continued Clinical Symptoms:  Alcohol Use Disorder Identification Test Final Score (AUDIT): 1 The "Alcohol Use Disorders Identification Test", Guidelines for Use in Primary Care, Second Edition.  World Science writerHealth Organization [facility](WHO). Score between 0-7:  no or low risk or alcohol related problems. Score between 8-15:  moderate risk of alcohol related problems. Score between 16-19:  high risk  of alcohol related problems. Score 20 or above:  warrants further diagnostic evaluation for alcohol dependence and treatment.   CLINICAL FACTORS:   Depression:   Anhedonia Hopelessness Impulsivity Insomnia   Musculoskeletal: Strength & Muscle Tone: within normal limits Gait & Station: normal Patient leans: N/A  Psychiatric Specialty Exam: Physical Exam  Review of Systems  Skin:       Scratches on her leg    Blood pressure 113/81, pulse (!) 105, temperature 98 F (36.7 C), temperature source Oral, resp. rate 20, height 5\' 6"  (1.676 m), weight 126.1 kg (278 lb), SpO2 99 %, not currently breastfeeding.Body mass index is 44.87 kg/m.  General Appearance: Guarded and obese  Eye Contact:  Fair  Speech:  Slow  Volume:  Decreased  Mood:  Anxious, Depressed, Dysphoric and Hopeless  Affect:  Constricted and Depressed  Thought Process:  Coherent  Orientation:  Full (Time, Place, and Person)  Thought Content:  WDL, Logical and Rumination  Suicidal Thoughts:  No  Homicidal Thoughts:  No  Memory:  Immediate;   Fair Recent;   Fair Remote;   Fair  Judgement:  Impaired  Insight:  Lacking  Psychomotor Activity:  Decreased  Concentration:  Concentration: Fair and Attention Span: Fair  Recall:  [employer]Fair  Fund of Knowledge:  Fair  Language:  Fair  Akathisia:  No  Handed:  Right  AIMS (if indicated):     Assets:  Desire for Improvement Housing  ADL's:  Intact  Cognition:  WNL  Sleep:  Number of Hours: 5.25      COGNITIVE FEATURES THAT  CONTRIBUTE TO RISK:  Thought constriction (tunnel vision)    SUICIDE RISK:   Mild:  Suicidal ideation of limited frequency, intensity, duration, and specificity.  There are no identifiable plans, no associated intent, mild dysphoria and related symptoms, good self-control (both objective and subjective assessment), few other risk factors, and identifiable protective factors, including available and accessible social support.   PLAN OF CARE: Patient  is 20 year old female who was admitted status post taking 6 pills of Xanax after feeling depressed and having suicidal thoughts and hopeless.  Please see history and physical for more information.  Patient requires inpatient treatment and stabilization.  I certify that inpatient services furnished can reasonably be expected to improve the patient's condition.  ARFEEN,SYED T., MD 10/06/2016, 10:34 AM

## 2016-10-06 NOTE — [facility] Group Notes (Signed)
Adult Group Therapy Note  Date:  10/06/2016  Time: 9:00AM-10:00AM  Group Topic/Focus: Today's group focused on the topic of healthy and unhealthy coping skills.  Commonalities were found in group members, including self-harm, isolation, anger outbursts, throwing things, and suicide attempts.  The reasons unhealthy coping skills are used were listed by group members, with a focus on them being fast, easy, and initially working.  The down sides were also discussed at length.  At the end of group, patients chose stationery on which they were assigned the task of writing a goodbye letter to an unhealthy coping technique that they believe they want to stop using.  Participation Level:  Active  Participation Quality:  Attentive, Monopolizing and Redirectable  Affect:  Blunted  Cognitive:  Alert  Insight: Improving  Engagement in Group:  Improving  Modes of Intervention:  Activity, Discussion and Support  Additional Comments:  The patient expressed that an unhealthy coping technique she uses is smoking marijuana, while a healthy one is distracting herself with her daughter outside the house.  She started group by being very argumentative, but eventually was able to focus on group instead.  She interjected stories about her life quite often, monopolizing the group time, but apologized to the group and was told by group members it was okay.  Carloyn JaegerMareida J Grossman-Orr 10/06/2016 , 12:18 PM

## 2016-10-06 NOTE — Progress Notes (Signed)
The patient approached the nurses station last evening at the outset of the shift and was upset. First of all, the patient stated that she did not belong in the hospital and was ready to be discharged. Secondly, the patient's female visitors made the same claim in that the patient needed to be discharged. Next, the patient stated that she did not feel safe on the unit. She explained that a female patient with a "scar" looked at her buttocks and stated that her body parts did not move the right way. This author reassured the patient that their would be a staff member on her hallway throughout the night. Patient repeated that she did not feel safe and that she wanted to be discharged. The patient's nurse was notified about the situation.

## 2016-10-07 NOTE — [facility] Group Notes (Addendum)
Adult Therapy Group Note  Date: 10/07/2016  Time:  9:00-10:00AM  Group Topic/Focus: Healthy Press photographerupport Systems   Building Self Esteem:   The focus of this group was to assist patients in identifying their current healthy and unhealthy supports, then discussing how to add additional healthy supports and reduce existing unhealthy ones.  Some group members were willing to read their "goodbye" letters assigned yesterday, which were goodbye's to whatever unhealthy coping technique they have been thinking about changing.  As they did this, CSW asked the group to work with the reader on identifying healthy supports that could help to achieve the desired change.  The healthy additions included supports such as 12-step groups, individual therapy, psychiatrists, faith activities,  sponsors, group therapy, support groups, classes on mental health, exercise/gym, meditation practice, and more.    Participation Level:  Active  Participation Quality:  Attentive and Sharing  Affect:  Blunted  Cognitive:  Appropriate  Insight: Good  Engagement in Group:  Engaged  Modes of Intervention:  Discussion and Support  Additional Comments:  The patient expressed that a current healthy support is her daughter and family, while marijuana is unhealthy for her.  She read her letter out loud, which was a goodbye to anger.  She was insightful about the negative effects her anger is having on her relationships with family members.  Lynnell ChadMareida J Grossman-Orr, LCSW 10/07/2016  3:40 PM

## 2016-10-07 NOTE — Progress Notes (Signed)
D   Pt is pleasant and cooperative    She is compliant with treatment and interacts well with others    She continues to request a roommate while she is here   Pt did say she needed help with her anger issues and said she always wants to be right and that is the source of her anger when someone points out that she isnt right A    Verbal support given    Medications administered and effectiveness monitored    Q 15 min checks R   Pt is safe at present and receptive to verbal support

## 2016-10-07 NOTE — Progress Notes (Addendum)
D [name] is doing  better and is  more comfortable today. And she points this out this morning when she is first seen by this Clinical research associatewriter. She completed her daily assessment and on it she wrote she deneid SI today and she rated her depression, hopelessness " 0/0/", respectively. A She is attentive in her groups and is actively talking about and processing her unhealthy behaviors. She points out that she is looking forward to going home . She received prn vistaril for c/o anxiety and 2 tylenolo for c/o headache, both that she received relief for.  R Safety  Is in place and  Pt is encouraged to focus on the now.

## 2016-10-07 NOTE — Progress Notes (Signed)
[facility]  10/07/2016 1:16 PM Charlotte [name]  MRN:  [id number] Subjective:  I am feeling better.  I'm sleeping good.  My anxiety is better with the medication. Objective;patient seen chart reviewed.  Patient remained anxious but overall doing better.  She has no dizziness.  She admitted that she was feeling overwhelmed but yesterday she saw her daughter and boyfriend who came to visit her.  She felt much better.  She is also talking to her other family members on the phone.  She still have episodes off crying and gets emotional but denies any active suicidal thoughts.  She is going to the groups.  She is sleeping good and her appetite is okay. Principal Problem: MDD (major depressive disorder), recurrent severe, without psychosis (HCC) Diagnosis:   Patient Active Problem List   Diagnosis Date Noted  . MDD (major depressive disorder), recurrent severe, without psychosis (HCC) [F33.2] 10/04/2016  . IUD check up [Z30.431] 07/14/2015  . Obesity [E66.9] 10/18/2014  . Hidradenitis suppurativa [L73.2] 10/18/2014   Total Time spent with patient: 20 minutes  Past Psychiatric History: please see history and physical information.  Past Medical History:  Past Medical History:  Diagnosis Date  . Asthma    given an inhaler 6th grade  . Obesity     Past Surgical History:  Procedure Laterality Date  . CESAREAN SECTION N/A 05/02/2015   Procedure: CESAREAN SECTION;  Surgeon: Tereso NewcomerUgonna A Anyanwu, MD;  Location: [facility];  Service: Obstetrics;  Laterality: N/A;   Family History:  Family History  Problem Relation Age of Onset  . Cancer Paternal Grandmother   . Diabetes Mother   . Diabetes Maternal Grandmother    Family Psychiatric  History: please see history and physical for more information. Social History:  History  Alcohol Use No     History  Drug Use  . Frequency: 7.0 times per week  . Types: Marijuana    Social History   Social History  . Marital status: Single    Spouse  name: N/A  . Number of children: N/A  . Years of education: N/A   Social History Main Topics  . Smoking status: Former Smoker    Packs/day: 0.25    Types: Cigars  . Smokeless tobacco: Never Used  . Alcohol use No  . Drug use:     Frequency: 7.0 times per week    Types: Marijuana  . Sexual activity: Not Currently    Birth control/ protection: IUD   Other Topics Concern  . None   Social History Narrative  . None   Additional Social History:    Pain Medications: none Prescriptions: none Over the Counter: none History of alcohol / drug use?: No history of alcohol / drug abuse                    Sleep: Good  Appetite:  Fair  Current Medications: Current Facility-Administered Medications  Medication Dose Route Frequency Provider Last Rate Last Dose  . acetaminophen (TYLENOL) tablet 650 mg  650 mg Oral Q6H PRN Laveda AbbeLaurie Britton Parks, NP      . alum & mag hydroxide-simeth (MAALOX/MYLANTA) 200-200-20 MG/5ML suspension 30 mL  30 mL Oral Q4H PRN Laveda AbbeLaurie Britton Parks, NP      . bacitracin ointment   Topical BID Laveda AbbeLaurie Britton Parks, NP   1 application at 10/05/16 [phone]  . FLUoxetine (PROZAC) capsule 10 mg  10 mg Oral Daily Oneta Rackanika N Lewis, NP   10 mg at 10/07/16  [id number]  . hydrOXYzine (ATARAX/VISTARIL) tablet 25 mg  25 mg Oral Q6H PRN Laveda AbbeLaurie Britton Parks, NP   25 mg at 10/06/16 2239  . magnesium hydroxide (MILK OF MAGNESIA) suspension 30 mL  30 mL Oral Daily PRN Laveda AbbeLaurie Britton Parks, NP      . traZODone (DESYREL) tablet 50 mg  50 mg Oral QHS PRN Laveda AbbeLaurie Britton Parks, NP   50 mg at 10/06/16 2239    Lab Results: No results found for this or any previous visit (from the past 48 hour(s)).  Blood Alcohol level:  Lab Results  Component Value Date   ETH <5 10/04/2016    Metabolic Disorder Labs: No results found for: HGBA1C, MPG No results found for: PROLACTIN No results found for: CHOL, TRIG, HDL, CHOLHDL, VLDL, LDLCALC  Physical Findings: AIMS: Facial and Oral  Movements Muscles of Facial Expression: None, normal Lips and Perioral Area: None, normal Jaw: None, normal Tongue: None, normal,Extremity Movements Upper (arms, wrists, hands, fingers): None, normal Lower (legs, knees, ankles, toes): None, normal, Trunk Movements Neck, shoulders, hips: None, normal, Overall Severity Severity of abnormal movements (highest score from questions above): None, normal Incapacitation due to abnormal movements: None, normal Patient's awareness of abnormal movements (rate only patient's report): No Awareness, Dental Status Current problems with teeth and/or dentures?: No Does patient usually wear dentures?: No  CIWA:  CIWA-Ar Total: 3 COWS:  COWS Total Score: 9  Musculoskeletal: Strength & Muscle Tone: within normal limits Gait & Station: normal Patient leans: N/A  Psychiatric Specialty Exam: Physical Exam  Review of Systems  Constitutional: Negative.   HENT: Negative.   Musculoskeletal: Negative.   Skin: Negative.   Neurological: Negative.  Negative for dizziness.  Psychiatric/Behavioral: Positive for depression. The patient is nervous/anxious.     Blood pressure (!) 145/75, pulse 89, temperature 97.7 F (36.5 C), temperature source Oral, resp. rate 18, height 5\' 6"  (1.676 m), weight 126.1 kg (278 lb), SpO2 99 %, not currently breastfeeding.Body mass index is 44.87 kg/m.  General Appearance: Casual and Fairly Groomed  Eye Contact:  Fair  Speech:  Clear and Coherent  Volume:  Normal  Mood:  Anxious  Affect:  Congruent  Thought Process:  Goal Directed  Orientation:  Full (Time, Place, and Person)  Thought Content:  WDL and Logical  Suicidal Thoughts:  No  Homicidal Thoughts:  No  Memory:  Immediate;   Good Recent;   Good Remote;   Fair  Judgement:  Fair  Insight:  Fair  Psychomotor Activity:  Normal  Concentration:  Concentration: Good and Attention Span: Good  Recall:  Good  Fund of Knowledge:  Good  Language:  Good  Akathisia:  No   Handed:  Right  AIMS (if indicated):     Assets:  Communication Skills Desire for Improvement Housing  ADL's:  Intact  Cognition:  WNL  Sleep:  Number of Hours: 5.75     Treatment Plan Summary: Daily contact with patient to assess and evaluate symptoms and progress in treatment and Medication management  Continue Prozac 10 mg daily.  Continue Trilafon and 50 mg at bedtime.  Review blood work results, basic chemistry and CBC is normal.  Alcohol level less than 5.urine drug screen positive for benzodiazepine and cannabis.  Patient tolerating her medication and reporting no side effects. Patient also submitted 72 hour notice to discharge.  Patient will be discharged tomorrow morning.  Maribella Kuna T., MD 10/07/2016, 1:16 PM

## 2016-10-08 DIAGNOSIS — Z79899 Other long term (current) drug therapy: Secondary | ICD-10-CM

## 2016-10-08 DIAGNOSIS — Z87891 Personal history of nicotine dependence: Secondary | ICD-10-CM

## 2016-10-08 MED ORDER — TRAZODONE HCL 50 MG PO TABS: 50.0000 mg | ORAL_TABLET | Freq: Every evening | ORAL | 0 refills | Status: DC | PRN

## 2016-10-08 MED ORDER — FLUOXETINE HCL 10 MG PO CAPS: 10.0000 mg | ORAL_CAPSULE | Freq: Every day | ORAL | 0 refills | Status: DC

## 2016-10-08 MED ORDER — BACITRACIN ZINC 500 UNIT/GM EX OINT: TOPICAL_OINTMENT | Freq: Two times a day (BID) | CUTANEOUS | 0 refills | Status: DC

## 2016-10-08 MED ORDER — HYDROXYZINE HCL 25 MG PO TABS: 25.0000 mg | ORAL_TABLET | Freq: Four times a day (QID) | ORAL | 0 refills | Status: DC | PRN
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
## 2016-10-08 NOTE — [facility] Group Notes (Signed)
Pt attended group. The subject was on wellness. The question was asked What is the one thing that would make your life better? Pt. Stated to inquire about stress therapy and work on anger management.

## 2016-10-08 NOTE — Progress Notes (Signed)
  [facility] :  Will you be returning to the same living situation after discharge:  Yes,  Pt returning home with family At discharge, do you have transportation home?: Yes,  Pt family to pick up Do you have the ability to pay for your medications: Yes,  Pt provided with samples and prescriptions  Release of information consent forms completed and in the chart;  Patient's signature needed at discharge.  Patient to Follow up at: Follow-up Information    [facility] Follow up.   Specialty:  Behavioral Health Why:  Please go within 1-3 days of discharge to be assessed for medication management and therapy services. Walk-in hours are from 8am-3pm Monday-Friday. Contact informationElpidio Eric: [address] [place] [address] [phone]        [facility] Follow up on 10/26/2016.   Why:  at 10:00am for your first therapy appointment with Aleima. Please arrive at 9:40am to complete paperwork.  Contact information: [facility] [address]  [address]  [place], [place] [address] PHONE: [phone] FAX: [phone]          Next level of care provider [facility] Link:no  Safety Planning and Suicide Prevention discussed: Yes,  with father; see SPE note  Have you used any form of tobacco in the last 30 days? (Cigarettes, Smokeless Tobacco, Cigars, and/or Pipes): No  Has patient been referred to the Quitline?: N/A patient is not a smoker  Patient has been referred for addiction treatment: Yes  Verdene LennertLauren C Nakima Fluegge 10/08/2016, 9:30 AM

## 2016-10-08 NOTE — [facility] Suicide Risk Assessment (Signed)
[facility]   Principal Problem: MDD (major depressive disorder), recurrent severe, without psychosis (HCC) Discharge Diagnoses:  Patient Active Problem List   Diagnosis Date Noted  . MDD (major depressive disorder), recurrent severe, without psychosis (HCC) [F33.2] 10/04/2016  . IUD check up [Z30.431] 07/14/2015  . Obesity [E66.9] 10/18/2014  . Hidradenitis suppurativa [L73.2] 10/18/2014    Total Time spent with patient: 30 minutes  Musculoskeletal: Strength & Muscle Tone: within normal limits Gait & Station: normal Patient leans: N/A  Psychiatric Specialty Exam: ROS   Blood pressure 114/78, pulse 84, temperature 97.3 F (36.3 C), temperature source Oral, resp. rate 18, height 5\' 6"  (1.676 m), weight 278 lb (126.1 kg), SpO2 99 %, not currently breastfeeding.Body mass index is 44.87 kg/m.  General Appearance: Well Groomed  Eye Contact::  Good  Speech:  Normal Rate409  Volume:  Normal  Mood:  improved mood , denies feeling depressed at this time  Affect:  Appropriate, smiles at times appropriately   Thought Process:  Linear  Orientation:  Full (Time, Place, and Person)  Thought Content:  denies hallucinations, no delusions, not internally preoccupied   Suicidal Thoughts:  No- denies any suicidal or self injurious ideations   Homicidal Thoughts:  No- no homicidal or violent ideations   Memory:  recent and remote grossly internally preoccupied   Judgement:  Other:  improving   Insight:  improving   Psychomotor Activity:  Normal  Concentration:  Good  Recall:  Good  Fund of Knowledge:Good  Language: Good  Akathisia:  Negative  Handed:  Right  AIMS (if indicated):     Assets:  Communication Skills Resilience  Sleep:  Number of Hours: 5.25  Cognition: WNL  ADL's:  Intact   Mental Status Per Nursing Assessment::   On Admission:     Demographic Factors:  20 year old single female, has a one year old daughter , lives with parents, employed    Loss Factors: Single mother  Historical Factors: Denies any prior psychiatric admissions, no history of suicide attempts, denies history of substance abuse   Risk Reduction Factors:   Responsible for children under 19 years of age, Sense of responsibility to family, Employed, Living with another person, especially a relative, Positive social support and Positive coping skills or problem solving skills  Continued Clinical Symptoms:  At this time patient is alert, attentive, calm, well related, pleasant, mood is "OK", denies depression at this time, and presents euthymic, affect appropriate, reactive, no thought disorder, no suicidal ideations, no self injurious ideations, no homicidal or violent ideations, no hallucinations, no delusions,  future oriented, stated she is going to take certificate to be able to work at [employer] .  Denies medication side effects. Behavior on unit calm and in good control. We have reviewed  Medication side effects, to include potential increased risk of suicidal ideations early in treatment with antidepressants in young adults   Cognitive Features That Contribute To Risk:  No gross cognitive deficits noted upon discharge. Is alert , attentive, and oriented x 3   Suicide Risk:  Mild:  Suicidal ideation of limited frequency, intensity, duration, and specificity.  There are no identifiable plans, no associated intent, mild dysphoria and related symptoms, good self-control (both objective and subjective assessment), few other risk factors, and identifiable protective factors, including available and accessible social support.  Follow-up Information    [facility] Follow up.   Specialty:  Behavioral Health Why:  Please go within 1-3 days of discharge to be assessed  for medication management and therapy services. Walk-in hours are from 8am-3pm Monday-Friday. Contact informationElpidio Eric: [address] [place] [address] [phone]        [facility] Follow up on 10/26/2016.   Why:  at 10:00am for your first therapy appointment with Aleima. Please arrive at 9:40am to complete paperwork.  Contact information: [facility] [address]  [address]  [place], [place] [address] PHONE: [phone] FAX: [phone]          Plan Of Care/Follow-up recommendations:  Activity:  as tolereated  Diet:  Regular Tests:  NA Other:  See below  Patient is requesting discharge and there are no current grounds for involuntary commitment  She is leaving unit in good spirits She is returning home- lives with parents Patient denies any pattern of substance abuse, we reviewed concerns about abusing , misusing medications such as BZDs- patient reports understanding of the above, and states " I am never going to using that stuff again".  Follow up as above   Nehemiah MassedOBOS, FERNANDO, MD 10/08/2016, 10:35 AM

## 2016-10-08 NOTE — Progress Notes (Signed)
Discharge note:  Patient discharged home per MD order.  Patient received prescriptions and medication samples.  Patient denies any thoughts of self harm.  [facility]  Patient did not have any personal belongings in a locker.  She left ambulatory with her family.  Social worker spoke with the family briefly before patient was discharged.  Patient had been told to expect discharge at 1300, however, she called her family early this morning.

## 2016-10-08 NOTE — Tx Team (Signed)
Interdisciplinary Treatment and Diagnostic Plan Update  10/08/2016 Time of Session: 8:30 AM  Charlotte [name] MRN: [id number]  Principal Diagnosis: MDD (major depressive disorder), recurrent severe, without psychosis (HCC)  Secondary Diagnoses: Principal Problem:   MDD (major depressive disorder), recurrent severe, without psychosis (HCC)   Current Medications:  Current Facility-Administered Medications  Medication Dose Route Frequency Provider Last Rate Last Dose  . acetaminophen (TYLENOL) tablet 650 mg  650 mg Oral Q6H PRN Charlotte AbbeLaurie Britton Parks, NP   650 mg at 10/07/16 1808  . alum & mag hydroxide-simeth (MAALOX/MYLANTA) 200-200-20 MG/5ML suspension 30 mL  30 mL Oral Q4H PRN Charlotte AbbeLaurie Britton Parks, NP      . bacitracin ointment   Topical BID Charlotte AbbeLaurie Britton Parks, NP   1 application at 10/05/16 [phone]  . FLUoxetine (PROZAC) capsule 10 mg  10 mg Oral Daily Charlotte Rackanika N Lewis, NP   10 mg at 10/08/16 [phone]  . hydrOXYzine (ATARAX/VISTARIL) tablet 25 mg  25 mg Oral Q6H PRN Charlotte AbbeLaurie Britton Parks, NP   25 mg at 10/07/16 2335  . magnesium hydroxide (MILK OF MAGNESIA) suspension 30 mL  30 mL Oral Daily PRN Charlotte AbbeLaurie Britton Parks, NP      . traZODone (DESYREL) tablet 50 mg  50 mg Oral QHS PRN Charlotte AbbeLaurie Britton Parks, NP   50 mg at 10/07/16 2335    PTA Medications: Prescriptions Prior to Admission  Medication Sig Dispense Refill Last Dose  . [DISCONTINUED] NYSTATIN, TOPICAL, POWD Apply 1 application topically 3 (three) times daily. (Patient not taking: Reported on 10/05/2016) 30 g 0 Not Taking at Unknown time  . [DISCONTINUED] ondansetron (ZOFRAN) 8 MG tablet Take 1 tablet (8 mg total) by mouth every 8 (eight) hours as needed for nausea or vomiting. (Patient not taking: Reported on 10/05/2016) 10 tablet 0 Not Taking at Unknown time  . [DISCONTINUED] ranitidine (ZANTAC) 150 MG tablet Take 1 tablet (150 mg total) by mouth 2 (two) times daily. (Patient not taking: Reported on 10/05/2016) 30 tablet 0 Not Taking at  Unknown time    Treatment Modalities: Medication Management, Group therapy, Case management,  1 to 1 session with clinician, Psychoeducation, Recreational therapy.  Patient Stressors: Financial difficulties Marital or family conflict Occupational concerns Traumatic event  Patient Strengths: Ability for insight Active sense of humor Communication skills General fund of knowledge Physical Health Supportive family/friends  Physician Treatment Plan for Primary Diagnosis: MDD (major depressive disorder), recurrent severe, without psychosis (HCC) Long Term Goal(s): Improvement in symptoms so as ready for discharge  Short Term Goals: Ability to verbalize feelings will improve Ability to disclose and discuss suicidal ideas Ability to identify and develop effective coping behaviors will improve Ability to maintain clinical measurements within normal limits will improve Compliance with prescribed medications will improve Ability to identify triggers associated with substance abuse/mental health issues will improve  Medication Management: Evaluate patient's response, side effects, and tolerance of medication regimen.  Therapeutic Interventions: 1 to 1 sessions, Unit Group sessions and Medication administration.  Evaluation of Outcomes: Adequate for Discharge  Physician Treatment Plan for Secondary Diagnosis: Principal Problem:   MDD (major depressive disorder), recurrent severe, without psychosis (HCC)   Long Term Goal(s): Improvement in symptoms so as ready for discharge  Short Term Goals: Ability to verbalize feelings will improve Ability to disclose and discuss suicidal ideas Ability to identify and develop effective coping behaviors will improve Ability to maintain clinical measurements within normal limits will improve Compliance with prescribed medications will improve Ability to identify triggers associated with substance abuse/mental  health issues will improve  Medication  Management: Evaluate patient's response, side effects, and tolerance of medication regimen.  Therapeutic Interventions: 1 to 1 sessions, Unit Group sessions and Medication administration.  Evaluation of Outcomes: Adequate for Discharge   RN Treatment Plan for Primary Diagnosis: MDD (major depressive disorder), recurrent severe, without psychosis (HCC) Long Term Goal(s): Knowledge of disease and therapeutic regimen to maintain health will improve  Short Term Goals: Ability to verbalize feelings will improve, Ability to disclose and discuss suicidal ideas and Ability to identify and develop effective coping behaviors will improve  Medication Management: RN will administer medications as ordered by provider, will assess and evaluate patient's response and provide education to patient for prescribed medication. RN will report any adverse and/or side effects to prescribing provider.  Therapeutic Interventions: 1 on 1 counseling sessions, Psychoeducation, Medication administration, Evaluate responses to treatment, Monitor vital signs and CBGs as ordered, Perform/monitor CIWA, COWS, AIMS and Fall Risk screenings as ordered, Perform wound care treatments as ordered.  Evaluation of Outcomes: Adequate for Discharge   LCSW Treatment Plan for Primary Diagnosis: MDD (major depressive disorder), recurrent severe, without psychosis (HCC) Long Term Goal(s): Safe transition to appropriate next level of care at discharge, Engage patient in therapeutic group addressing interpersonal concerns.  Short Term Goals: Engage patient in aftercare planning with referrals and resources, Identify triggers associated with mental health/substance abuse issues and Increase skills for wellness and recovery  Therapeutic Interventions: Assess for all discharge needs, 1 to 1 time with Social worker, Explore available resources and support systems, Assess for adequacy in community support network, Educate family and significant  other(s) on suicide prevention, Complete Psychosocial Assessment, Interpersonal group therapy.  Evaluation of Outcomes: Adequate for Discharge   Progress in Treatment: Attending groups: Yes Participating in groups: Yes Taking medication as prescribed: Yes, MD continues to assess for medication changes as needed Toleration medication: Yes, no side effects reported at this time Family/Significant other contact made: Yes with father Patient understands diagnosis: Developing insight Discussing patient identified problems/goals with staff: Yes Medical problems stabilized or resolved: Yes Denies suicidal/homicidal ideation: Yes Issues/concerns per patient self-inventory: None Other: N/A  New problem(s) identified: None identified at this time.   New Short Term/Long Term Goal(s): None identified at this time.   Discharge Plan or Barriers: Pt will return home and follow-up with outpatient services.   Reason for Continuation of Hospitalization: Depression Medication stabilization   Estimated Length of Stay: 0 days  Attendees: Patient: 10/08/2016  8:30 AM  Physician: Dr. Jama Flavorsobos 10/08/2016  8:30 AM  Nursing: Melvern Samplehrista Dopson, Caroline Beaudry, RN 10/08/2016  8:30 AM  RN Care Manager: Onnie BoerJennifer Clark, RN 10/08/2016  8:30 AM  Social Worker: Vernie ShanksLauren Aveleen Nevers, LCSW; Belenda CruiseKristin Drinkard, LCSW 10/08/2016  8:30 AM  Recreational Therapist:  10/08/2016  8:30 AM  Other: Baxter Kailonrad Withrow, Tanika [name]; NP 10/08/2016  8:30 AM  Other:  10/08/2016  8:30 AM  Other: 10/08/2016  8:30 AM    Scribe for Treatment Team: Verdene LennertLauren C Raunak Antuna, LCSW 10/08/2016 8:30 AM

## 2016-10-08 NOTE — Progress Notes (Signed)
Recreation Therapy Notes  Date: 10/08/16 Time: 0930 Location: [address] Dayroom  Group Topic: Stress Management  Goal Area(s) Addresses:  Patient will verbalize importance of using healthy stress management.  Patient will identify positive emotions associated with healthy stress management.   Behavioral Response: Engaged  Intervention: Guided Imagery  Activity :  Peaceful Waves.  LRT introduced the stress management technique of guided imagery.  LRT read a script to allow patients to engage in the technique.  Patients were to follow along as LRT read script.  Education:  Stress Management, Discharge Planning.   Education Outcome: Acknowledges edcuation/In group clarification offered/Needs additional education  Clinical Observations/Feedback: Pt attended group.    Caroll RancherMarjette Mery Guadalupe, LRT/CTRS          Caroll RancherLindsay, Tamyka Bezio A 10/08/2016 12:41 PM

## 2016-10-08 NOTE — [facility] Group Notes (Signed)
Patient attend group. Her day was a 10/ she's happy today. Everyone made her feel better. Her family visit was positive and talking to them on the phone was great. Going to the gym help her energy she play ball. Now she's looking forward to going home.

## 2016-11-19 ENCOUNTER — Emergency Department (HOSPITAL_COMMUNITY)
Admission: EM | Admit: 2016-11-19 | Discharge: 2016-11-19 | Discharge status: Against Medical Advice | Payer: Medicaid Other

## 2016-11-19 NOTE — ED Notes (Signed)
Called for pt times 3 loud throughout the waiting room. No answer

## 2016-11-19 NOTE — ED Notes (Signed)
Called Pt times 3. No Answer

## 2016-11-19 NOTE — ED Notes (Signed)
Called for pt

## 2017-01-01 ENCOUNTER — Encounter (HOSPITAL_COMMUNITY): Payer: Self-pay | Admitting: *Deleted

## 2017-01-01 ENCOUNTER — Inpatient Hospital Stay (HOSPITAL_COMMUNITY)
Admission: AD | Admit: 2017-01-01 | Discharge: 2017-01-01 | Disposition: A | Discharge status: Home / Self Care | Payer: Medicaid Other | Source: Ambulatory Visit | Attending: Family Medicine | Admitting: Family Medicine

## 2017-01-01 DIAGNOSIS — Z87891 Personal history of nicotine dependence: Secondary | ICD-10-CM | POA: Insufficient documentation

## 2017-01-01 DIAGNOSIS — N3000 Acute cystitis without hematuria: Secondary | ICD-10-CM

## 2017-01-01 DIAGNOSIS — Z975 Presence of (intrauterine) contraceptive device: Secondary | ICD-10-CM | POA: Insufficient documentation

## 2017-01-01 DIAGNOSIS — N941 Unspecified dyspareunia: Secondary | ICD-10-CM | POA: Insufficient documentation

## 2017-01-01 DIAGNOSIS — Z79899 Other long term (current) drug therapy: Secondary | ICD-10-CM | POA: Insufficient documentation

## 2017-01-01 LAB — URINALYSIS, ROUTINE W REFLEX MICROSCOPIC
Bilirubin Urine: NEGATIVE
GLUCOSE, UA: NEGATIVE mg/dL
Hgb urine dipstick: NEGATIVE
Ketones, ur: NEGATIVE mg/dL
Leukocytes, UA: NEGATIVE
Nitrite: POSITIVE — AB
PROTEIN: NEGATIVE mg/dL
Specific Gravity, Urine: 1.024 (ref 1.005–1.030)
Squamous Epithelial / LPF: NONE SEEN
pH: 7 (ref 5.0–8.0)

## 2017-01-01 LAB — WET PREP, GENITAL
CLUE CELLS WET PREP: NONE SEEN
Sperm: NONE SEEN
TRICH WET PREP: NONE SEEN
Yeast Wet Prep HPF POC: NONE SEEN

## 2017-01-01 LAB — POCT PREGNANCY, URINE: PREG TEST UR: NEGATIVE

## 2017-01-01 MED ORDER — SULFAMETHOXAZOLE-TRIMETHOPRIM 800-160 MG PO TABS: 1.0000 | ORAL_TABLET | Freq: Two times a day (BID) | ORAL | 1 refills | Status: DC

## 2017-01-01 NOTE — MAU Note (Signed)
For a couple wks, has had this pain in abd, like a pulling sensation. Has been having a d/c for a wk or so, has a bad odor. Has been kind of nauseous, is pretty sure she is not pregnant, has an IUD.  During intercourse thought it feels like something is poking her, has been having pain for about a month.  IUD placed July 2017

## 2017-01-01 NOTE — Discharge Instructions (Signed)
Dispareunia en las mujeres (Dyspareunia, Female) La dispareunia es el dolor asociado con la actividad sexual. Este puede afectar cualquier parte de los genitales o el abdomen bajo y existen varias causas posibles. La afeccin vara de leve a grave. Segn sea la causa, la dispareunia puede mejorar con un tratamiento o puede volver a presentarse con el correr del [place]. CAUSAS No siempre se conoce la causa de esta afeccin. Las causas posibles son las siguientes:  Database administrator.  Factores psicolgicos, como depresin, ansiedad o experiencias traumticas previas.  Dolor intenso y sensibilidad al palpar la piel que rodea la vagina (vulva), sndrome de vestibulitis vulvar.  Infeccin de la pelvis o la vulva.  Infeccin de la vagina.  Espasmos (contracciones) involuntarias y dolorosas de los msculos de la vagina cuando cualquier cosa se introduce en la vagina (vaginismo).  Reaccin alrgica.  Quistes ovricos.  Crecimientos slidos de los tejidos (tumores) en los ovarios o el tero.  Tejido cicatricial en los ovarios, vagina o pelvis.  Sequedad vaginal.  Adelgazamiento del tejido (atrofia) de la vulva y la vagina.  Afecciones cutneas que afectan la vulva (dermatosis vulvar), como el liquen escleroso o el liquen plano.  Endometriosis.  Embarazo tubrico.  Un tero inclinado.  Prolapso uterino.  Adherencias en la vagina.  Problemas de la vejiga.  Problemas intestinales.  Ciertos medicamentos.  Enfermedades como diabetes, artritis o enfermedad de la tiroides. FACTORES DE RIESGO Los siguientes factores pueden hacer que usted sea propensa a sufrir esta afeccin:  Production manager experimentado trauma fsico o sexual.  Charlotte [name] dado a luz ms de una vez.  Usar anticonceptivos orales.  Haber atravesado la menopausia.  Haber dado a luz recientemente, generalmente en los ltimos 3 o 6 meses.  Charlotte [name]. SNTOMAS El principal sntoma de esta afeccin es Chief Technology Officer en cualquier  parte de los genitales o el abdomen inferior durante o despus de la actividad sexual. Esto puede incluir dolor durante la excitacin sexual, estimulacin genital u orgasmo. El dolor puede empeorar cuando cualquier cosa se introduce en la vagina o al tocar, de cualquier manera, los genitales como por ejemplo, al sentarse o vestir pantalones. El dolor puede variar de leve a intenso, segn la causa de la afeccin. En ciertos casos, los sntomas desaparecen con el tratamiento y vuelven a aparecer ms adelante. DIAGNSTICO Esta afeccin se puede diagnosticar en funcin de lo siguiente:  Los sntomas, por ejemplo:  La ubicacin del dolor.  Cundo Medical laboratory scientific officer.  Sus antecedentes mdicos.  Un examen fsico. Este puede incluir un examen plvico y un papanicolau. Es una prueba de deteccin que se [place] para Engineer, manufacturing signos de cncer de vagina, cuello del tero y tero.  Otros estudios que pueden realizarle son los siguientes:  [place] de [place].  Ecografa. Esta [place] ondas de sonido para crear imgenes de una zona que est siendo examinada.  Cultivo de [place]. Este estudio incluye analizar una muestra de orina para detectar signos de infeccin.  Prueba de cultivo. El mdico [place] un hisopo para tomar una muestra de flujo vaginal. Charlotte [name] es examinada para detectar signos de infeccin.  Radiografas.  Una resonancia magntica (RM).  Una tomografa computarizada (TC).  Una laparoscopia. Este es un procedimiento en el que se realiza una pequea incisin en el abdomen inferior y, a travs de Charlotte [name], se pasa un instrumento liviano y del tamao de un lpiz (laparoscopio) para examinar dentro de la pelvis. Pueden derivarlo a un mdico que se especialice en la salud de las mujeres (gineclogo). En algunos casos,  el diagnstico de la dispareunia puede ser difcil. TRATAMIENTO El tratamiento depende de la causa de su enfermedad y la gravedad de sus sntomas. En la [employer], deber  detener su actividad sexual hasta que los sntomas mejoren. El tratamiento puede incluir lo siguiente:  Lubricantes.  Ejercicios de Kegel o dilatadores vaginales.  Cremas con medicamentos para la piel.  Cremas vaginales con medicamentos.  Terapia hormonal.  Antibiticos para prevenir o combatir infecciones.  Medicamentos que ayudan a Engineer, materials.  Medicamentos que tratan la depresin (antidepresivos).  Asesoramiento psicolgico.  Terapia sexual.  Ciruga. INSTRUCCIONES PARA EL CUIDADO EN EL HOGAR Estilo de vida   Evite las ropas ajustadas y los materiales que irritan la zona genital y abdominal.  Use lubricantes a base de agua como sea necesario. Evite el uso de lubricantes a base de aceite.  No use los productos que le causen irritacin. Esto puede incluir ciertos condones, espermicidas, lubricantes, jabones, tampones, aerosoles vaginales o duchas.  Siempre tenga sexo con proteccin. Hable con su mdico sobre qu mtodo anticonceptivo es el ms apropiado para usted.  Mantenga una comunicacin abierta con su compaero sexual. Instrucciones generales   Tome los medicamentos de venta libre y los recetados solamente como se lo haya indicado el mdico.  Si le realizaron [place], es su responsabilidad retirar [place]. Consulte a su mdico o en el departamento donde se realice el procedimiento cundo estarn [employer].  Orine antes de [employer] sexual.  Considere la posibilidad de Advertising account planner en un grupo de apoyo.  Concurra a todas las visitas de control como se lo haya indicado el mdico. Esto es importante. SOLICITE ATENCIN MDICA SI:  Tiene hemorragias durante las [employer].  Observa un bulto en la abertura de la vagina. Solicite asistencia mdica incluso si el bulto no es doloroso.  Tiene los siguientes sntomas:  Flujo vaginal anormal.  Sequedad vaginal.  Picazn o irritacin de la vulva o la vagina.  Una nueva  erupcin cutnea.  Sntomas que empeoran o no mejoran con Scientist, research (medical).  Grant Ruts.  Dolor al Beatrix Shipper.  Sangre en la orina. SOLICITE ATENCIN MDICA DE INMEDIATO SI:  Siente dolor abdominal intenso durante o poco despus de las [employer].  Se desmaya despus de Management consultant. Esta informacin no tiene Theme park manager el consejo del mdico. Asegrese de hacerle al mdico cualquier pregunta que tenga. Document Released: 07/11/2011 Document Revised: 02/20/2016 Document Reviewed: 05/31/2015 Elsevier Interactive Patient Education  2017 Elsevier Inc.  Urinary Tract Infection, Adult A urinary tract infection (UTI) is an infection of any part of the urinary tract, which includes the kidneys, ureters, bladder, and urethra. These organs make, store, and get rid of urine in the body. UTI can be a bladder infection (cystitis) or kidney infection (pyelonephritis). What are the causes? This infection may be caused by fungi, viruses, or bacteria. Bacteria are the most common cause of UTIs. This condition can also be caused by repeated incomplete emptying of the bladder during urination. What increases the risk? This condition is more likely to develop if:  You ignore your need to urinate or hold urine for long periods of time.  You do not empty your bladder completely during urination.  You wipe back to front after urinating or having a bowel movement, if you are female.  You are uncircumcised, if you are female.  You are constipated.  You have a urinary catheter that stays in place (indwelling).  You have a weak defense (immune)  system.  You have a medical condition that affects your bowels, kidneys, or bladder.  You have diabetes.  You take antibiotic medicines frequently or for long periods of time, and the antibiotics no longer work well against certain types of infections (antibiotic resistance).  You take medicines that irritate your urinary tract.  You  are exposed to chemicals that irritate your urinary tract.  You are female. What are the signs or symptoms? Symptoms of this condition include:  Fever.  Frequent urination or passing small amounts of urine frequently.  Needing to urinate urgently.  Pain or burning with urination.  Urine that smells bad or unusual.  Cloudy urine.  Pain in the lower abdomen or back.  Trouble urinating.  Blood in the urine.  Vomiting or being less hungry than normal.  Diarrhea or abdominal pain.  Vaginal discharge, if you are female. How is this diagnosed? This condition is diagnosed with a medical history and physical exam. You will also need to provide a urine sample to test your urine. Other tests may be done, including:  Blood tests.  Sexually transmitted disease (STD) testing. If you have had more than one UTI, a cystoscopy or imaging studies may be done to determine the cause of the infections. How is this treated? Treatment for this condition often includes a combination of two or more of the following:  Antibiotic medicine.  Other medicines to treat less common causes of UTI.  Over-the-counter medicines to treat pain.  Drinking enough water to stay hydrated. Follow these instructions at home:  Take over-the-counter and prescription medicines only as told by your health care provider.  If you were prescribed an antibiotic, take it as told by your health care provider. Do not stop taking the antibiotic even if you start to feel better.  Avoid alcohol, caffeine, tea, and carbonated beverages. They can irritate your bladder.  Drink enough fluid to keep your urine clear or pale yellow.  Keep all follow-up visits as told by your health care provider. This is important.  Make sure to:  Empty your bladder often and completely. Do not hold urine for long periods of time.  Empty your bladder before and after sex.  Wipe from front to back after a bowel movement if you are  female. Use each tissue one time when you wipe. Contact a health care provider if:  You have back pain.  You have a fever.  You feel nauseous or vomit.  Your symptoms do not get better after 3 days.  Your symptoms go away and then return. Get help right away if:  You have severe back pain or lower abdominal pain.  You are vomiting and cannot keep down any medicines or water. This information is not intended to replace advice given to you by your health care provider. Make sure you discuss any questions you have with your health care provider. Document Released: 08/08/2005 Document Revised: 04/11/2016 Document Reviewed: 09/19/2015 Elsevier Interactive Patient Education  2017 Elsevier Inc.  Dyspareunia, Female Dyspareunia is pain that is associated with sexual activity. This can affect any part of the genitals or lower abdomen, and there are many possible causes. This condition ranges from mild to severe. Depending on the cause, dyspareunia may get better with treatment, or it may return (recur) over time. What are the causes? The cause of this condition is not always known. Possible causes include:  Cancer.  Psychological factors, such as depression, anxiety, or previous traumatic experiences.  Severe pain and tenderness of  the skin around the vagina (vulva) when it is touched (vulvar vestibulitis syndrome).  Infection of the pelvis or the vulva.  Infection of the vagina.  Painful, involuntary tightening (contraction) of the vaginal muscles when anything is put inside the vagina (vaginismus).  Allergic reaction.  Ovarian cysts.  Solid growths of tissue (tumors) in the ovaries or the uterus.  Scar tissue in the ovaries, vagina, or pelvis.  Vaginal dryness.  Thinning of the tissue (atrophy) of the vulva and vagina.  Skin conditions that affect the vulva (vulvar dermatoses), such as lichen sclerosus or lichen planus.  Endometriosis.  Tubal pregnancy.  A tilted  uterus.  Uterine prolapse.  Adhesions in the vagina.  Bladder problems.  Intestinal problems.  Certain medicines.  Medical conditions such as diabetes, arthritis, or thyroid disease. What increases the risk? The following factors may make you more likely to develop this condition:  Having experienced physical or sexual trauma.  Having given birth more than once.  Taking birth control pills.  Having gone through menopause.  Having recently given birth, typically within the past 3-6 months.  Breastfeeding. What are the signs or symptoms? The main symptom of this condition is pain in any part of the genitals or lower abdomen during or after sexual activity. This may include pain during sexual arousal, genital stimulation, or orgasm. Pain may get worse when anything is inserted into the vagina, or when the genitals are touched in any way, such as when sitting or wearing pants. Pain can range from mild to severe, depending on the cause of the condition. In some cases, symptoms go away with treatment and return (recur) at a later date. How is this diagnosed? This condition may be diagnosed based on:  Your symptoms, including:  Where your pain is located.  When your pain occurs.  Your medical history.  A physical exam. This may include a pelvic exam and a Pap test. This is a screening test that is used to check for signs of cancer of the vagina, cervix, and uterus.  Tests, including:  Blood tests.  Ultrasound. This uses sound waves to make a picture of the area that is being tested.  Urine culture. This test involves checking a urine sample for signs of infection.  Culture test. This is when your health care provider uses a swab to collect a sample of vaginal fluid. The sample is checked for signs of infection.  X-rays.  MRI.  CT scan.  Laparoscopy. This is a procedure in which a small incision is made in your lower abdomen and a lighted, pencil-sized instrument  (laparoscope) is passed through the incision and used to look inside your pelvis. You may be referred to a health care provider who specializes in women's health (gynecologist). In some cases, diagnosing the cause of dyspareunia can be difficult. How is this treated? Treatment depends on the cause of your condition and your symptoms. In most cases, you may need to stop sexual activity until your symptoms improve. Treatment may include:  Lubricants.  Kegel exercises or vaginal dilators.  Medicated skin creams.  Medicated vaginal creams.  Hormonal therapy.  Antibiotic medicine to prevent or fight infection.  Medicines that help to relieve pain.  Medicines that treat depression (antidepressants).  Psychological counseling.  Sex therapy.  Surgery. Follow these instructions at home: Lifestyle  Avoid tight clothing and irritating materials around your genital and abdominal area.  Use water-based lubricants as needed. Avoid oil-based lubricants.  Do not use any products that irritate you.  This may include certain condoms, spermicides, lubricants, soaps, tampons, vaginal sprays, or douches.  Always practice safe sex. Talk with your health care provider about which form of birth control (contraception) is best for you.  Maintain open communication with your sexual partner. General instructions  Take over-the-counter and prescription medicines only as told by your health care provider.  If you had tests done, it is your responsibility to get your tests results. Ask your health care provider or the department performing the test when your results will be ready.  Urinate before you engage in sexual activity.  Consider joining a support group.  Keep all follow-up visits as told by your health care provider. This is important. Contact a health care provider if:  You develop vaginal bleeding after sexual intercourse.  You develop a lump at the opening of your vagina. Seek  medical care even if the lump is painless.  You have:  Abnormal vaginal discharge.  Vaginal dryness.  Itchiness or irritation of your vulva or vagina.  A new rash.  Symptoms that get worse or do not improve with treatment.  A fever.  Pain when you urinate.  Blood in your urine. Get help right away if:  You develop severe pain in your abdomen during or shortly after sexual intercourse.  You pass out after having sexual intercourse. This information is not intended to replace advice given to you by your health care provider. Make sure you discuss any questions you have with your health care provider. Document Released: 11/18/2007 Document Revised: 03/09/2016 Document Reviewed: 05/31/2015 Elsevier Interactive Patient Education  2017 [employer].

## 2017-01-01 NOTE — MAU Provider Note (Signed)
Chief Complaint:  Abdominal Pain and Vaginal Discharge   First Provider Initiated Contact with Patient 01/01/17 1611       HPI: Charlotte [name] is a 21 y.o. G1P1001 who presents to maternity admissions reporting pulling sensation, not quite pain, in lower abdomen.  Was worried it was because of the IUD.  ALso has a vaginal discharge with odor.  Also has nausea and thinks she may be pregnant due to this symptom.  When she has sex, feels intermittent sharp pains in lower pelvis, worried IUD is poking the inside of her uterus. . She reports no vaginal bleeding, vaginal itching/burning, urinary symptoms, h/a, dizziness, vomiting, or fever/chills.    Abdominal Pain  This is a new problem. The current episode started 1 to 4 weeks ago. The onset quality is gradual. The problem occurs intermittently. The problem has been unchanged. The pain is located in the LLQ, RUQ and suprapubic region. The pain is mild. The quality of the pain is a sensation of fullness and sharp. The abdominal pain does not radiate. Pertinent negatives include no constipation, diarrhea, fever, frequency, headaches, myalgias, nausea or vomiting. Nothing aggravates the pain. The pain is relieved by nothing. She has tried nothing for the symptoms.  Vaginal Discharge  The patient's primary symptoms include a genital odor and vaginal discharge. The patient's pertinent negatives include no genital itching or genital lesions. This is a new problem. The current episode started in the past 7 days. The problem occurs constantly. The problem has been unchanged. The pain is mild. She is not pregnant. Associated symptoms include abdominal pain. Pertinent negatives include no constipation, diarrhea, fever, frequency, headaches, nausea or vomiting. The vaginal discharge was white and milky. There has been no bleeding. She has not been passing clots. She has not been passing tissue. Nothing aggravates the symptoms. She has tried nothing for the symptoms.     RN Note: For a couple wks, has had this pain in abd, like a pulling sensation. Has been having a d/c for a wk or so, has a bad odor. Has been kind of nauseous, is pretty sure she is not pregnant, has an IUD.  During intercourse thought it feels like something is poking her, has been having pain for about a month.  IUD placed July 2017  Past Medical History: Past Medical History:  Diagnosis Date  . Asthma    given an inhaler 6th grade  . Obesity     Past obstetric history: OB History  Gravida Para Term Preterm AB Living  1 1 1     1   SAB TAB Ectopic Multiple Live Births        0 1    # Outcome Date GA Lbr Len/2nd Weight Sex Delivery Anes PTL Lv  1 Term 05/02/15 [id number]  9 lb 4 oz (4.196 kg) F CS-LTranv EPI  LIV     Birth Comments: WNL      Past Surgical History: Past Surgical History:  Procedure Laterality Date  . CESAREAN SECTION N/A 05/02/2015   Procedure: CESAREAN SECTION;  Surgeon: Tereso NewcomerUgonna A Anyanwu, MD;  Location: [facility];  Service: Obstetrics;  Laterality: N/A;    Family History: Family History  Problem Relation Age of Onset  . Cancer Paternal Grandmother   . Diabetes Mother   . Diabetes Maternal Grandmother     Social History: Social History  Substance Use Topics  . Smoking status: Former Smoker    Packs/day: 0.25    Types: Cigars  . Smokeless tobacco: Never  Used  . Alcohol use Yes    Allergies: No Known Allergies  Meds:  Prescriptions Prior to Admission  Medication Sig Dispense Refill Last Dose  . bacitracin ointment Apply topically 2 (two) times daily. Abrasion to legs (Patient not taking: Reported on 01/01/2017) 120 g 0 Completed Course at Unknown time  . FLUoxetine (PROZAC) 10 MG capsule Take 1 capsule (10 mg total) by mouth daily. (Patient not taking: Reported on 01/01/2017) 30 capsule 0 Not Taking at Unknown time  . hydrOXYzine (ATARAX/VISTARIL) 25 MG tablet Take 1 tablet (25 mg total) by mouth every 6 (six) hours as needed for anxiety. (Patient not  taking: Reported on 01/01/2017) 30 tablet 0 Not Taking at Unknown time  . traZODone (DESYREL) 50 MG tablet Take 1 tablet (50 mg total) by mouth at bedtime as needed for sleep. (Patient not taking: Reported on 01/01/2017) 30 tablet 0 Not Taking at Unknown time    I have reviewed patient's Past Medical Hx, Surgical Hx, Family Hx, Social Hx, medications and allergies.  ROS:  Review of Systems  Constitutional: Negative for fever.  Gastrointestinal: Positive for abdominal pain. Negative for constipation, diarrhea, nausea and vomiting.  Genitourinary: Positive for vaginal discharge. Negative for frequency.  Musculoskeletal: Negative for myalgias.  Neurological: Negative for headaches.   Other systems negative     Physical Exam  Patient Vitals for the past 24 hrs:  BP Temp Temp src Pulse Resp SpO2 Weight  01/01/17 1538 125/74 98.9 F (37.2 C) Oral 72 18 100 % 277 lb 12.8 oz (126 kg)   Constitutional: Well-developed, well-nourished female in no acute distress.  Cardiovascular: normal rate and rhythm, no ectopy audible, S1 & S2 heard, no murmur Respiratory: normal effort, no distress. Lungs CTAB with no wheezes or crackles GI: Abd soft, non-tender.  Nondistended.  No rebound, No guarding.  Bowel Sounds audible  MS: Extremities nontender, no edema, normal ROM Neurologic: Alert and oriented x 4.   Grossly nonfocal. GU: Neg CVAT. Skin:  Warm and Dry Psych:  Affect appropriate.  PELVIC EXAM: Cervix pink, visually closed, without lesion, scant white creamy discharge, vaginal walls and external genitalia normal    IUD STRING VISIBLE Bimanual exam: Cervix firm, anterior, neg CMT, uterus nontender, nonenlarged, adnexa without tenderness, enlargement, or mass   IUD is not palpable on bimanual exam.  Uterus is not particularly tender.     Labs: Results for orders placed or performed during the hospital encounter of 01/01/17 (from the past 24 hour(s))  Urinalysis, Routine w reflex microscopic      Status: Abnormal   Collection Time: 01/01/17  3:40 PM  Result Value Ref Range   Color, Urine YELLOW YELLOW   APPearance HAZY (A) CLEAR   Specific Gravity, Urine 1.024 1.005 - 1.030   pH 7.0 5.0 - 8.0   Glucose, UA NEGATIVE NEGATIVE mg/dL   Hgb urine dipstick NEGATIVE NEGATIVE   Bilirubin Urine NEGATIVE NEGATIVE   Ketones, ur NEGATIVE NEGATIVE mg/dL   Protein, ur NEGATIVE NEGATIVE mg/dL   Nitrite POSITIVE (A) NEGATIVE   Leukocytes, UA NEGATIVE NEGATIVE   RBC / HPF 0-5 0 - 5 RBC/hpf   WBC, UA 0-5 0 - 5 WBC/hpf   Bacteria, UA MANY (A) NONE SEEN   Squamous Epithelial / LPF NONE SEEN NONE SEEN   Mucous PRESENT   Pregnancy, urine POC     Status: None   Collection Time: 01/01/17  3:49 PM  Result Value Ref Range   Preg Test, Ur NEGATIVE NEGATIVE  Ref. Range 01/01/2017 16:20  Yeast Wet Prep HPF POC Latest Ref Range: NONE SEEN  NONE SEEN  Trich, Wet Prep Latest Ref Range: NONE SEEN  NONE SEEN  Clue Cells Wet Prep HPF POC Latest Ref Range: NONE SEEN  NONE SEEN  WBC, Wet Prep HPF POC Latest Ref Range: NONE SEEN  FEW (A)    Imaging:  No results found.  MAU Course/MDM: I have ordered labs as follows:  Wet prep, UA, UPT Imaging ordered: none indicated Results reviewed.   No sign of BV or trich or yeast.  Urine does indicate probable UTI.     Treatments in MAU included none.   Pt stable at time of discharge.  Assessment: Acute cystitis without hematuria - Plan: Discharge patient, Discharge patient  Dyspareunia in female - Plan: Discharge patient  No indication of displacement of IUD  Plan: Discharge home Recommend conservative care and increased PO water intake Rx sent for Bactrim DS for UTI Urine to culture   Encouraged to return here or to other Urgent Care/ED if she develops worsening of symptoms, increase in pain, fever, or other concerning symptoms.   Wynelle Bourgeois CNM, MSN Certified Nurse-Midwife 01/01/2017 4:28 PM

## 2017-01-03 LAB — URINE CULTURE: SPECIAL REQUESTS: NORMAL

## 2017-09-20 ENCOUNTER — Encounter (HOSPITAL_COMMUNITY): Payer: Self-pay

## 2017-09-20 ENCOUNTER — Emergency Department (HOSPITAL_COMMUNITY)
Admission: EM | Admit: 2017-09-20 | Discharge: 2017-09-20 | Disposition: A | Discharge status: Home / Self Care | Payer: No Typology Code available for payment source | Attending: Emergency Medicine | Admitting: Emergency Medicine

## 2017-09-20 ENCOUNTER — Emergency Department (HOSPITAL_COMMUNITY): Payer: No Typology Code available for payment source

## 2017-09-20 DIAGNOSIS — Z79899 Other long term (current) drug therapy: Secondary | ICD-10-CM | POA: Insufficient documentation

## 2017-09-20 DIAGNOSIS — J45909 Unspecified asthma, uncomplicated: Secondary | ICD-10-CM | POA: Insufficient documentation

## 2017-09-20 DIAGNOSIS — M25561 Pain in right knee: Secondary | ICD-10-CM | POA: Diagnosis present

## 2017-09-20 NOTE — ED Notes (Signed)
Patient informed staff that she had to leave to go take care of her child. Patient spoke with PA and given discharge paperwork with referral to orthopedics. Patient left ambulatory with family.
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
## 2017-09-20 NOTE — Discharge Instructions (Signed)
Rest the knee Ice - ice for 20 minutes at a time, several times a day Ibuprofen - take with food. Take up to 3-4 times daily Follow up with Orthopedics Return for worsening symptoms
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
## 2018-08-29 ENCOUNTER — Encounter (HOSPITAL_COMMUNITY): Payer: Self-pay | Admitting: Emergency Medicine

## 2018-08-29 ENCOUNTER — Other Ambulatory Visit: Payer: Self-pay

## 2018-08-29 ENCOUNTER — Emergency Department (HOSPITAL_COMMUNITY)
Admission: EM | Admit: 2018-08-29 | Discharge: 2018-08-29 | Disposition: A | Discharge status: Home / Self Care | Payer: Self-pay | Attending: Emergency Medicine | Admitting: Emergency Medicine

## 2018-08-29 DIAGNOSIS — R079 Chest pain, unspecified: Secondary | ICD-10-CM | POA: Insufficient documentation

## 2018-08-29 DIAGNOSIS — Z5321 Procedure and treatment not carried out due to patient leaving prior to being seen by health care provider: Secondary | ICD-10-CM | POA: Insufficient documentation

## 2018-08-29 LAB — CBC
HEMATOCRIT: 42.6 % (ref 36.0–46.0)
HEMOGLOBIN: 13.8 g/dL (ref 12.0–15.0)
MCH: 30.4 pg (ref 26.0–34.0)
MCHC: 32.4 g/dL (ref 30.0–36.0)
MCV: 93.8 fL (ref 80.0–100.0)
Platelets: 287 10*3/uL (ref 150–400)
RBC: 4.54 MIL/uL (ref 3.87–5.11)
RDW: 11.8 % (ref 11.5–15.5)
WBC: 11.5 10*3/uL — ABNORMAL HIGH (ref 4.0–10.5)
nRBC: 0 % (ref 0.0–0.2)

## 2018-08-29 LAB — I-STAT TROPONIN, ED: Troponin i, poc: 0 ng/mL (ref 0.00–0.08)

## 2018-08-29 LAB — BASIC METABOLIC PANEL
Anion gap: 8 (ref 5–15)
BUN: 10 mg/dL (ref 6–20)
CO2: 27 mmol/L (ref 22–32)
Calcium: 9.7 mg/dL (ref 8.9–10.3)
Chloride: 105 mmol/L (ref 98–111)
Creatinine, Ser: 0.78 mg/dL (ref 0.44–1.00)
GFR calc Af Amer: >60 mL/min (ref >60)
GFR calc non Af Amer: >60 mL/min (ref >60)
GLUCOSE: 78 mg/dL (ref 70–99)
POTASSIUM: 4.3 mmol/L (ref 3.5–5.1)
Sodium: 140 mmol/L (ref 135–145)

## 2018-08-29 LAB — I-STAT BETA HCG BLOOD, ED (MC, WL, AP ONLY): I-stat hCG, quantitative: <5 m[IU]/mL (ref <5)

## 2018-08-29 NOTE — ED Triage Notes (Signed)
C/o sob and pain across chest x 3 days.  Denies nausea and vomiting.  Smokes marijuana daily.

## 2018-08-29 NOTE — ED Notes (Signed)
Pt called by this nurse x2 tech and no answer

## 2018-08-29 NOTE — ED Notes (Signed)
Pt called for vitals no answer. °

## 2019-03-27 ENCOUNTER — Other Ambulatory Visit: Payer: Self-pay

## 2019-03-27 ENCOUNTER — Emergency Department (HOSPITAL_COMMUNITY)
Admission: EM | Admit: 2019-03-27 | Discharge: 2019-03-28 | Disposition: A | Discharge status: Home / Self Care | Payer: Medicaid Other | Attending: Emergency Medicine | Admitting: Emergency Medicine

## 2019-03-27 ENCOUNTER — Encounter (HOSPITAL_COMMUNITY): Payer: Self-pay

## 2019-03-27 DIAGNOSIS — Z5321 Procedure and treatment not carried out due to patient leaving prior to being seen by health care provider: Secondary | ICD-10-CM | POA: Insufficient documentation

## 2019-03-27 DIAGNOSIS — R109 Unspecified abdominal pain: Secondary | ICD-10-CM | POA: Insufficient documentation

## 2019-03-27 LAB — URINALYSIS, ROUTINE W REFLEX MICROSCOPIC
Bacteria, UA: NONE SEEN
Bilirubin Urine: NEGATIVE
Glucose, UA: NEGATIVE mg/dL
Hgb urine dipstick: NEGATIVE
Ketones, ur: 5 mg/dL — AB
Leukocytes,Ua: NEGATIVE
Nitrite: NEGATIVE
Protein, ur: 30 mg/dL — AB
Specific Gravity, Urine: 1.036 — ABNORMAL HIGH (ref 1.005–1.030)
pH: 5 (ref 5.0–8.0)

## 2019-03-27 LAB — COMPREHENSIVE METABOLIC PANEL
ALT: 24 U/L (ref 0–44)
AST: 25 U/L (ref 15–41)
Albumin: 4.7 g/dL (ref 3.5–5.0)
Alkaline Phosphatase: 84 U/L (ref 38–126)
Anion gap: 13 (ref 5–15)
BUN: 15 mg/dL (ref 6–20)
CO2: 24 mmol/L (ref 22–32)
Calcium: 9.8 mg/dL (ref 8.9–10.3)
Chloride: 103 mmol/L (ref 98–111)
Creatinine, Ser: 0.98 mg/dL (ref 0.44–1.00)
GFR calc Af Amer: >60 mL/min (ref >60)
GFR calc non Af Amer: >60 mL/min (ref >60)
Glucose, Bld: 112 mg/dL — ABNORMAL HIGH (ref 70–99)
Potassium: 3.8 mmol/L (ref 3.5–5.1)
Sodium: 140 mmol/L (ref 135–145)
Total Bilirubin: 0.7 mg/dL (ref 0.3–1.2)
Total Protein: 7.8 g/dL (ref 6.5–8.1)

## 2019-03-27 LAB — CBC
HCT: 48.2 % — ABNORMAL HIGH (ref 36.0–46.0)
Hemoglobin: 16 g/dL — ABNORMAL HIGH (ref 12.0–15.0)
MCH: 30.9 pg (ref 26.0–34.0)
MCHC: 33.2 g/dL (ref 30.0–36.0)
MCV: 93.2 fL (ref 80.0–100.0)
Platelets: 327 10*3/uL (ref 150–400)
RBC: 5.17 MIL/uL — ABNORMAL HIGH (ref 3.87–5.11)
RDW: 11.9 % (ref 11.5–15.5)
WBC: 22.9 10*3/uL — ABNORMAL HIGH (ref 4.0–10.5)
nRBC: 0 % (ref 0.0–0.2)

## 2019-03-27 LAB — I-STAT BETA HCG BLOOD, ED (MC, WL, AP ONLY): I-stat hCG, quantitative: <5 m[IU]/mL (ref <5)

## 2019-03-27 LAB — LIPASE, BLOOD: Lipase: 37 U/L (ref 11–51)

## 2019-03-27 MED ORDER — FENTANYL CITRATE (PF) 100 MCG/2ML IJ SOLN: 50.0000 ug | Freq: Once | INTRAMUSCULAR | Status: DC

## 2019-03-27 MED ORDER — SODIUM CHLORIDE 0.9 % IV BOLUS: 1000.0000 mL | Freq: Once | INTRAVENOUS | Status: DC

## 2019-03-27 MED ORDER — METOCLOPRAMIDE HCL 5 MG/ML IJ SOLN: 10.0000 mg | INTRAMUSCULAR | Status: DC

## 2019-03-27 MED ORDER — SODIUM CHLORIDE 0.9% FLUSH: 3.0000 mL | Freq: Once | INTRAVENOUS | Status: DC

## 2019-03-27 MED ORDER — ONDANSETRON 4 MG PO TBDP
4.0000 mg | ORAL_TABLET | Freq: Once | ORAL | Status: AC | PRN
  Administered 2019-03-27: 21:00:00 4 mg via ORAL
  Filled 2019-03-27: qty 1

## 2019-03-27 NOTE — ED Notes (Signed)
Pts urine sample was found sitting in wheelchair with no sign of pt in waiting room or in restrooms.

## 2019-03-27 NOTE — ED Triage Notes (Signed)
Pt reports sudden onset abd pain about 1 hr ago with vomiting. Pt actively vomiting in triage

## 2019-03-28 NOTE — ED Notes (Signed)
Called for room multiple times 

## 2019-12-07 ENCOUNTER — Ambulatory Visit: Payer: Medicaid Other | Attending: Internal Medicine

## 2019-12-07 DIAGNOSIS — Z20822 Contact with and (suspected) exposure to covid-19: Secondary | ICD-10-CM

## 2019-12-08 LAB — NOVEL CORONAVIRUS, NAA: SARS-CoV-2, NAA: NOT DETECTED

## 2020-03-09 ENCOUNTER — Other Ambulatory Visit: Payer: Medicaid Other

## 2020-07-11 ENCOUNTER — Other Ambulatory Visit: Payer: Self-pay

## 2020-07-11 ENCOUNTER — Ambulatory Visit (INDEPENDENT_AMBULATORY_CARE_PROVIDER_SITE_OTHER): Payer: Medicaid Other

## 2020-07-11 VITALS — BP 132/62 | HR 78 | Wt 264.0 lb

## 2020-07-11 DIAGNOSIS — Z30432 Encounter for removal of intrauterine contraceptive device: Secondary | ICD-10-CM

## 2020-07-11 DIAGNOSIS — Z3169 Encounter for other general counseling and advice on procreation: Secondary | ICD-10-CM

## 2020-07-11 MED ORDER — PREPLUS 27-1 MG PO TABS: 1.0000 | ORAL_TABLET | Freq: Every day | ORAL | 13 refills | Status: DC

## 2020-07-11 NOTE — Patient Instructions (Signed)
Preparing for Pregnancy °If you are considering becoming pregnant, make an appointment to see your regular health care provider to learn how to prepare for a safe and healthy pregnancy (preconception care). During a preconception care visit, your health care provider will: °· Do a complete physical exam, including a Pap test. °· Take a complete medical history. °· Give you information, answer your questions, and help you resolve problems. °Preconception checklist °Medical history °· Tell your health care provider about any current or past medical conditions. Your pregnancy or your ability to become pregnant may be affected by chronic conditions, such as diabetes, chronic hypertension, and thyroid problems. °· Include your family's medical history as well as your partner's medical history. °· Tell your health care provider about any history of STIs (sexually transmitted infections). These can affect your pregnancy. In some cases, they can be passed to your baby. Discuss any concerns that you have about STIs. °· If indicated, discuss the benefits of genetic testing. This testing will show whether there are any genetic conditions that may be passed from you or your partner to your baby. °· Tell your health care provider about: °? Any problems you have had with conception or pregnancy. °? Any medicines you take. These include vitamins, herbal supplements, and over-the-counter medicines. °? Your history of immunizations. Discuss any vaccinations that you may need. °Diet °· Ask your health care provider what to include in a healthy diet that has a balance of nutrients. This is especially important when you are pregnant or preparing to become pregnant. °· Ask your health care provider to help you reach a healthy weight before pregnancy. °? If you are overweight, you may be at higher risk for certain complications, such as high blood pressure, diabetes, and preterm birth. °? If you are underweight, you are more likely to  have a baby who has a low birth weight. °Lifestyle, work, and home °· Let your health care provider know: °? About any lifestyle habits that you have, such as alcohol use, drug use, or smoking. °? About recreational activities that may put you at risk during pregnancy, such as downhill skiing and certain exercise programs. °? Tell your health care provider about any international travel, especially any travel to places with an active Zika virus outbreak. °? About harmful substances that you may be exposed to at work or at home. These include chemicals, pesticides, radiation, or even litter boxes. °? If you do not feel safe at home. °Mental health °· Tell your health care provider about: °? Any history of mental health conditions, including feelings of depression, sadness, or anxiety. °? Any medicines that you take for a mental health condition. These include herbs and supplements. °Home instructions to prepare for pregnancy °Lifestyle ° °· Eat a balanced diet. This includes fresh fruits and vegetables, whole grains, lean meats, low-fat dairy products, healthy fats, and foods that are high in fiber. Ask to meet with a nutritionist or registered dietitian for assistance with meal planning and goals. °· Get regular exercise. Try to be active for at least 30 minutes a day on most days of the week. Ask your health care provider which activities are safe during pregnancy. °· Do not use any products that contain nicotine or tobacco, such as cigarettes and e-cigarettes. If you need help quitting, ask your health care provider. °· Do not drink alcohol. °· Do not take illegal drugs. °· Maintain a healthy weight. Ask your health care provider what weight range is right for you. °General   instructions °· Keep an accurate record of your menstrual periods. This makes it easier for your health care provider to determine your baby's due date. °· Begin taking prenatal vitamins and folic acid supplements daily as directed by your  health care provider. °· Manage any chronic conditions, such as high blood pressure and diabetes, as told by your health care provider. This is important. °How do I know that I am pregnant? °You may be pregnant if you have been sexually active and you miss your period. Symptoms of early pregnancy include: °· Mild cramping. °· Very light vaginal bleeding (spotting). °· Feeling unusually tired. °· Nausea and vomiting (morning sickness). °If you have any of these symptoms and you suspect that you might be pregnant, you can take a home pregnancy test. These tests check for a hormone in your urine (human chorionic gonadotropin, or hCG). A woman's body begins to make this hormone during early pregnancy. These tests are very accurate. Wait until at least the first day after you miss your period to take one. If the test shows that you are pregnant (you get a positive result), call your health care provider to make an appointment for prenatal care. °What should I do if I become pregnant? ° °  ° °· Make an appointment with your health care provider as soon as you suspect you are pregnant. °· Do not use any products that contain nicotine, such as cigarettes, chewing tobacco, and e-cigarettes. If you need help quitting, ask your health care provider. °· Do not drink alcoholic beverages. Alcohol is related to a number of birth defects. °· Avoid toxic odors and chemicals. °· You may continue to have sexual intercourse if it does not cause pain or other problems, such as vaginal bleeding. °This information is not intended to replace advice given to you by your health care provider. Make sure you discuss any questions you have with your health care provider. °Document Revised: 10/31/2017 Document Reviewed: 05/20/2016 °Elsevier Patient Education © 2020 [employer] ° °

## 2020-07-11 NOTE — Progress Notes (Signed)
    GYNECOLOGY OFFICE PROCEDURE NOTE  Charlotte [name] is a 24 year old G1P1001 here for Liletta IUD removal that was placed in July 2016.  Patient reports desire for conception. No GYN concerns.  No pap smear history due to age.  IUD Removal  Patient identified, informed consent performed, consent signed.  Patient was in the dorsal lithotomy position, normal external genitalia was noted.  A speculum was placed in the patient's vagina, normal discharge was noted, no lesions. The cervix was visualized, no lesions, no abnormal discharge.  The strings of the IUD were grasped and pulled using kelly forceps. The IUD was removed in its entirety.  Patient tolerated the procedure well.    Patient plans for pregnancy soon and preconception counseling given. She was encouraged to start PNV. Script sent to pharmacy on file.  Routine preventative health maintenance measures emphasized.  Cherre Robins MSN, CNM Advanced Practice Provider, [facility]

## 2020-11-07 ENCOUNTER — Inpatient Hospital Stay (HOSPITAL_COMMUNITY)
Admission: AD | Admit: 2020-11-07 | Discharge: 2020-11-07 | Disposition: A | Discharge status: Home / Self Care | Payer: Self-pay | Attending: Family Medicine | Admitting: Family Medicine

## 2020-11-07 ENCOUNTER — Inpatient Hospital Stay (HOSPITAL_COMMUNITY): Payer: Self-pay

## 2020-11-07 ENCOUNTER — Encounter (HOSPITAL_COMMUNITY): Payer: Self-pay | Admitting: Family Medicine

## 2020-11-07 ENCOUNTER — Other Ambulatory Visit: Payer: Self-pay

## 2020-11-07 DIAGNOSIS — R109 Unspecified abdominal pain: Secondary | ICD-10-CM

## 2020-11-07 DIAGNOSIS — O99891 Other specified diseases and conditions complicating pregnancy: Secondary | ICD-10-CM

## 2020-11-07 DIAGNOSIS — O26891 Other specified pregnancy related conditions, first trimester: Secondary | ICD-10-CM

## 2020-11-07 DIAGNOSIS — Z3A14 14 weeks gestation of pregnancy: Secondary | ICD-10-CM | POA: Insufficient documentation

## 2020-11-07 DIAGNOSIS — Z87891 Personal history of nicotine dependence: Secondary | ICD-10-CM | POA: Insufficient documentation

## 2020-11-07 DIAGNOSIS — O210 Mild hyperemesis gravidarum: Secondary | ICD-10-CM

## 2020-11-07 DIAGNOSIS — Z3490 Encounter for supervision of normal pregnancy, unspecified, unspecified trimester: Secondary | ICD-10-CM

## 2020-11-07 DIAGNOSIS — Z79899 Other long term (current) drug therapy: Secondary | ICD-10-CM | POA: Insufficient documentation

## 2020-11-07 DIAGNOSIS — Z3A01 Less than 8 weeks gestation of pregnancy: Secondary | ICD-10-CM

## 2020-11-07 DIAGNOSIS — O219 Vomiting of pregnancy, unspecified: Secondary | ICD-10-CM | POA: Insufficient documentation

## 2020-11-07 DIAGNOSIS — O0932 Supervision of pregnancy with insufficient antenatal care, second trimester: Secondary | ICD-10-CM | POA: Insufficient documentation

## 2020-11-07 DIAGNOSIS — M545 Low back pain, unspecified: Secondary | ICD-10-CM | POA: Insufficient documentation

## 2020-11-07 DIAGNOSIS — R824 Acetonuria: Secondary | ICD-10-CM

## 2020-11-07 DIAGNOSIS — O26892 Other specified pregnancy related conditions, second trimester: Secondary | ICD-10-CM | POA: Insufficient documentation

## 2020-11-07 LAB — URINALYSIS, ROUTINE W REFLEX MICROSCOPIC
Bilirubin Urine: NEGATIVE
Glucose, UA: NEGATIVE mg/dL
Hgb urine dipstick: NEGATIVE
Ketones, ur: 80 mg/dL — AB
Leukocytes,Ua: NEGATIVE
Nitrite: NEGATIVE
Protein, ur: NEGATIVE mg/dL
Specific Gravity, Urine: 1.026 (ref 1.005–1.030)
pH: 6 (ref 5.0–8.0)

## 2020-11-07 LAB — CBC
HCT: 39.9 % (ref 36.0–46.0)
Hemoglobin: 14 g/dL (ref 12.0–15.0)
MCH: 32.1 pg (ref 26.0–34.0)
MCHC: 35.1 g/dL (ref 30.0–36.0)
MCV: 91.5 fL (ref 80.0–100.0)
Platelets: 293 10*3/uL (ref 150–400)
RBC: 4.36 MIL/uL (ref 3.87–5.11)
RDW: 11.5 % (ref 11.5–15.5)
WBC: 11 10*3/uL — ABNORMAL HIGH (ref 4.0–10.5)
nRBC: 0 % (ref 0.0–0.2)

## 2020-11-07 LAB — COMPREHENSIVE METABOLIC PANEL
ALT: 31 U/L (ref 0–44)
AST: 21 U/L (ref 15–41)
Albumin: 3.9 g/dL (ref 3.5–5.0)
Alkaline Phosphatase: 60 U/L (ref 38–126)
Anion gap: 10 (ref 5–15)
BUN: 7 mg/dL (ref 6–20)
CO2: 22 mmol/L (ref 22–32)
Calcium: 9.3 mg/dL (ref 8.9–10.3)
Chloride: 103 mmol/L (ref 98–111)
Creatinine, Ser: 0.63 mg/dL (ref 0.44–1.00)
GFR, Estimated: >60 mL/min (ref >60)
Glucose, Bld: 87 mg/dL (ref 70–99)
Potassium: 3.5 mmol/L (ref 3.5–5.1)
Sodium: 135 mmol/L (ref 135–145)
Total Bilirubin: 0.8 mg/dL (ref 0.3–1.2)
Total Protein: 7 g/dL (ref 6.5–8.1)

## 2020-11-07 LAB — WET PREP, GENITAL
Sperm: NONE SEEN
Trich, Wet Prep: NONE SEEN
Yeast Wet Prep HPF POC: NONE SEEN

## 2020-11-07 LAB — HCG, QUANTITATIVE, PREGNANCY: hCG, Beta Chain, Quant, S: 85113 m[IU]/mL — ABNORMAL HIGH (ref <5)

## 2020-11-07 LAB — POCT PREGNANCY, URINE: Preg Test, Ur: POSITIVE — AB

## 2020-11-07 MED ORDER — DOXYLAMINE SUCCINATE (SLEEP) 25 MG PO TABS: 25.0000 mg | ORAL_TABLET | Freq: Three times a day (TID) | ORAL | 0 refills | Status: DC | PRN

## 2020-11-07 MED ORDER — LACTATED RINGERS IV BOLUS
1000.0000 mL | Freq: Once | INTRAVENOUS | Status: AC
  Administered 2020-11-07: 1000 mL via INTRAVENOUS

## 2020-11-07 MED ORDER — M.V.I. ADULT IV INJ
Freq: Once | INTRAVENOUS | Status: AC
  Filled 2020-11-07: qty 10

## 2020-11-07 MED ORDER — PROMETHAZINE HCL 12.5 MG PO TABS: 12.5000 mg | ORAL_TABLET | Freq: Four times a day (QID) | ORAL | 0 refills | Status: DC | PRN

## 2020-11-07 MED ORDER — VITAFOL GUMMIES 3.33-0.333-34.8 MG PO CHEW: 1.0000 | CHEWABLE_TABLET | Freq: Every day | ORAL | 3 refills | Status: AC

## 2020-11-07 MED ORDER — PYRIDOXINE HCL 25 MG PO TABS: 25.0000 mg | ORAL_TABLET | Freq: Three times a day (TID) | ORAL | 0 refills | Status: DC

## 2020-11-07 NOTE — MAU Note (Signed)
Pt has + HPT. State she has had had nausea/vomiting since last Thursday. LMP September 20.

## 2020-11-07 NOTE — MAU Provider Note (Signed)
History     CSN: [id number]  Arrival date and time: 11/07/20 1311   Event Date/Time   First Provider Initiated Contact with Patient 11/07/20 1434      Chief Complaint  Patient presents with   Nausea   Emesis   HPI Charlotte [name] is a 24 y.o. G2P1001 at [redacted] weeks GA by LMP who presents to MAU with chief complaint of nausea and vomiting.  This is a new problem, onset Thursday 11/03/2020. Patient has not been able to tolerate anything PO since that time. She does not have access to antiemetic medication but has attempted management with seabands and diet revision.  Patient endorses irregular lower abdominal and low back pain. She endorses heavy lifting as part of her job. She denies vaginal bleeding, abdominal tenderness, dysuria, fever or recent illness.  Patient has not selected a prenatal care provider.   OB History    Gravida  2   Para  1   Term  1   Preterm      AB      Living  1     SAB      IAB      Ectopic      Multiple  0   Live Births  1           Past Medical History:  Diagnosis Date   Asthma    given an inhaler 6th grade   Obesity     Past Surgical History:  Procedure Laterality Date   CESAREAN SECTION N/A 05/02/2015   Procedure: CESAREAN SECTION;  Surgeon: [name], MD;  Location: [facility];  Service: Obstetrics;  Laterality: N/A;    Family History  Problem Relation Age of Onset   Cancer Paternal Grandmother    Diabetes Mother    Diabetes Maternal Grandmother     Social History   Tobacco Use   Smoking status: Former Smoker    Packs/day: 0.25    Types: Cigars   Smokeless tobacco: Never Used  Substance Use Topics   Alcohol use: Yes   Drug use: Not Currently    Frequency: 7.0 times per week    Types: Marijuana    Comment: 11/03/2020    Allergies: No Known Allergies  Medications Prior to Admission  Medication Sig Dispense Refill Last Dose   Prenatal Vit-Fe Fumarate-FA (PREPLUS) 27-1 MG TABS Take 1  tablet by mouth daily. 30 tablet 13 11/06/2020 at Unknown time   bacitracin ointment Apply topically 2 (two) times daily. Abrasion to legs (Patient not taking: Reported on 01/01/2017) 120 g 0    FLUoxetine (PROZAC) 10 MG capsule Take 1 capsule (10 mg total) by mouth daily. (Patient not taking: Reported on 01/01/2017) 30 capsule 0    hydrOXYzine (ATARAX/VISTARIL) 25 MG tablet Take 1 tablet (25 mg total) by mouth every 6 (six) hours as needed for anxiety. (Patient not taking: Reported on 01/01/2017) 30 tablet 0    sulfamethoxazole-trimethoprim (BACTRIM DS,SEPTRA DS) 800-160 MG tablet Take 1 tablet by mouth 2 (two) times daily. (Patient not taking: Reported on 07/11/2020) 14 tablet 1    traZODone (DESYREL) 50 MG tablet Take 1 tablet (50 mg total) by mouth at bedtime as needed for sleep. (Patient not taking: Reported on 01/01/2017) 30 tablet 0     Review of Systems  Gastrointestinal: Positive for abdominal pain, nausea and vomiting.  Musculoskeletal: Positive for back pain.  All other systems reviewed and are negative.  Physical Exam   Blood pressure 133/70, pulse 79, temperature  98.6 F (37 C), temperature source Oral, resp. rate 19, height 5\' 8"  (1.727 m), weight 118.8 kg, last menstrual period 08/01/2020, SpO2 99 %.  Physical Exam Vitals and nursing note reviewed. Exam conducted with a chaperone present.  Constitutional:      Appearance: Normal appearance. She is not ill-appearing.  Cardiovascular:     Rate and Rhythm: Normal rate.     Pulses: Normal pulses.     Heart sounds: Normal heart sounds.  Pulmonary:     Effort: Pulmonary effort is normal.     Breath sounds: Normal breath sounds.  Abdominal:     General: Abdomen is flat. Bowel sounds are normal. There is no distension.     Tenderness: There is no abdominal tenderness. There is no right CVA tenderness or left CVA tenderness.  Skin:    Capillary Refill: Capillary refill takes less than 2 seconds.  Neurological:     Mental  Status: She is alert and oriented to person, place, and time.  Psychiatric:        Mood and Affect: Mood normal.        Behavior: Behavior normal.        Thought Content: Thought content normal.        Judgment: Judgment normal.     MAU Course  Procedures  Orders Placed This Encounter  Procedures   Wet prep, genital   08/03/2020 OB LESS THAN 14 WEEKS WITH OB TRANSVAGINAL   Urinalysis, Routine w reflex microscopic   CBC   Comprehensive metabolic panel   hCG, quantitative, pregnancy   Nursing communication   Pregnancy, urine POC   Insert peripheral IV   Patient Vitals for the past 24 hrs:  BP Temp Temp src Pulse Resp SpO2 Height Weight  11/07/20 1901 130/71 -- -- (!) 59 17 99 % -- --  11/07/20 1422 133/70 -- -- 79 -- -- -- --  11/07/20 1328 (!) 129/56 98.6 F (37 C) Oral 65 19 99 % 5\' 8"  (1.727 m) 118.8 kg   Results for orders placed or performed during the hospital encounter of 11/07/20 (from the past 24 hour(s))  Pregnancy, urine POC     Status: Abnormal   Collection Time: 11/07/20  1:37 PM  Result Value Ref Range   Preg Test, Ur POSITIVE (A) NEGATIVE  Urinalysis, Routine w reflex microscopic Urine, Clean Catch     Status: Abnormal   Collection Time: 11/07/20  1:38 PM  Result Value Ref Range   Color, Urine YELLOW YELLOW   APPearance HAZY (A) CLEAR   Specific Gravity, Urine 1.026 1.005 - 1.030   pH 6.0 5.0 - 8.0   Glucose, UA NEGATIVE NEGATIVE mg/dL   Hgb urine dipstick NEGATIVE NEGATIVE   Bilirubin Urine NEGATIVE NEGATIVE   Ketones, ur 80 (A) NEGATIVE mg/dL   Protein, ur NEGATIVE NEGATIVE mg/dL   Nitrite NEGATIVE NEGATIVE   Leukocytes,Ua NEGATIVE NEGATIVE  CBC     Status: Abnormal   Collection Time: 11/07/20  3:04 PM  Result Value Ref Range   WBC 11.0 (H) 4.0 - 10.5 K/uL   RBC 4.36 3.87 - 5.11 MIL/uL   Hemoglobin 14.0 12.0 - 15.0 g/dL   HCT 11/09/20 11/09/20 - 46.6 %   MCV 91.5 80.0 - 100.0 fL   MCH 32.1 26.0 - 34.0 pg   MCHC 35.1 30.0 - 36.0 g/dL   RDW 59.9 35.7  - 01.7 %   Platelets 293 150 - 400 K/uL   nRBC 0.0 0.0 - 0.2 %  Comprehensive metabolic panel     Status: None   Collection Time: 11/07/20  3:04 PM  Result Value Ref Range   Sodium 135 135 - 145 mmol/L   Potassium 3.5 3.5 - 5.1 mmol/L   Chloride 103 98 - 111 mmol/L   CO2 22 22 - 32 mmol/L   Glucose, Bld 87 70 - 99 mg/dL   BUN 7 6 - 20 mg/dL   Creatinine, Ser 6.290.63 0.44 - 1.00 mg/dL   Calcium 9.3 8.9 - 52.810.3 mg/dL   Total Protein 7.0 6.5 - 8.1 g/dL   Albumin 3.9 3.5 - 5.0 g/dL   AST 21 15 - 41 U/L   ALT 31 0 - 44 U/L   Alkaline Phosphatase 60 38 - 126 U/L   Total Bilirubin 0.8 0.3 - 1.2 mg/dL   GFR, Estimated >41>60 >32>60 mL/min   Anion gap 10 5 - 15  hCG, quantitative, pregnancy     Status: Abnormal   Collection Time: 11/07/20  3:04 PM  Result Value Ref Range   hCG, Beta Chain, Quant, S 85,113 (H) <5 mIU/mL  Wet prep, genital     Status: Abnormal   Collection Time: 11/07/20  3:04 PM   Specimen: PATH Cytology Cervicovaginal Ancillary Only  Result Value Ref Range   Yeast Wet Prep HPF POC NONE SEEN NONE SEEN   Trich, Wet Prep NONE SEEN NONE SEEN   Clue Cells Wet Prep HPF POC PRESENT (A) NONE SEEN   WBC, Wet Prep HPF POC MODERATE (A) NONE SEEN   Sperm NONE SEEN    US OB LESS THAN 14 WEEKS WITH OB TRANSVAGINAL  Result Date: 11/07/2020 CLINICAL DATA:  24 year old pregnant female with nausea vomiting. LMP: 08/01/2020 corresponding to an estimated gestational age of [redacted] weeks, 0 days. EXAM: OBSTETRIC <14 WK US AND TRANSVAGINAL OB US TECHNIQUE: Both transabdominal and transvaginal ultrasound examinations were performed for complete evaluation of the gestation as well as the maternal uterus, adnexal regions, and pelvic cul-de-sac. Transvaginal technique was performed to assess early pregnancy. COMPARISON:  None. FINDINGS: Intrauterine gestational sac: Single intrauterine gestational sac. Yolk sac:  Seen Embryo:  Present Cardiac Activity: Detected Heart Rate: 112 bpm CRL: 7 mm   6 w   3 d                   US EDC: 06/30/2021 Subchorionic hemorrhage:  None visualized. Maternal uterus/adnexae: The right ovary is unremarkable. The left ovary is not visualized. A C-section scar is noted. IMPRESSION: Single live intrauterine pregnancy with an estimated gestational age of [redacted] weeks, 3 days based on ultrasound, discordant with clinical age based on LMP. Electronically Signed   By: Elgie CollardArash  Radparvar M.D.   On: 11/07/2020 16:17   Meds ordered this encounter  Medications   lactated ringers bolus 1,000 mL   multivitamins adult (INFUVITE ADULT) 10 mL in lactated ringers 1,000 mL infusion   Prenatal Vit-Fe Phos-FA-Omega (VITAFOL GUMMIES) 3.33-0.333-34.8 MG CHEW    Sig: Chew 1 each by mouth at bedtime.    Dispense:  30 tablet    Refill:  3    Order Specific Question:   Supervising Provider    Answer:   Levie HeritageSTINSON, JACOB J [[id number]]   pyridOXINE (VITAMIN B-6) 25 MG tablet    Sig: Take 1 tablet (25 mg total) by mouth every 8 (eight) hours.    Dispense:  30 tablet    Refill:  0    Order Specific Question:   Supervising Provider  Answer:   Levie Heritage [[id number]]   doxylamine, Sleep, (UNISOM) 25 MG tablet    Sig: Take 1 tablet (25 mg total) by mouth every 8 (eight) hours as needed.    Dispense:  30 tablet    Refill:  0    Order Specific Question:   Supervising Provider    Answer:   Levie Heritage [[id number]]   promethazine (PHENERGAN) 12.5 MG tablet    Sig: Take 1 tablet (12.5 mg total) by mouth every 6 (six) hours as needed for nausea or vomiting. For nausea and vomiting not relieved by B6 and Unisom    Dispense:  30 tablet    Refill:  0    Order Specific Question:   Supervising Provider    Answer:   Levie Heritage [[id number]]   Assessment and Plan  --24 y.o. G2P1001 at [redacted]w[redacted]d  --Ketonuria, new outpatient medication regimen --Tolerating PO prior to discharge --Discharge home in stable condition  F/U: --Given list of prenatal care providers with [facility] credentials --Given list of safe medications in  pregnancy  Calvert Cantor, CNM 11/07/2020, 8:50 PM

## 2020-11-07 NOTE — Discharge Instructions (Signed)
First Trimester of Pregnancy  The first trimester of pregnancy is from week 1 until the end of week 13 (months 1 through 3). During this time, your baby will begin to develop inside you. At 6-8 weeks, the eyes and face are formed, and the heartbeat can be seen on ultrasound. At the end of 12 weeks, all the baby's organs are formed. Prenatal care is all the medical care you receive before the birth of your baby. Make sure you get good prenatal care and follow all of your doctor's instructions. Follow these instructions at home: Medicines  Take over-the-counter and prescription medicines only as told by your doctor. Some medicines are safe and some medicines are not safe during pregnancy.  Take a prenatal vitamin that contains at least 600 micrograms (mcg) of folic acid.  If you have trouble pooping (constipation), take medicine that will make your stool soft (stool softener) if your doctor approves. Eating and drinking   Eat regular, healthy meals.  Your doctor will tell you the amount of weight gain that is right for you.  Avoid raw meat and uncooked cheese.  If you feel sick to your stomach (nauseous) or throw up (vomit): ? Eat 4 or 5 small meals a day instead of 3 large meals. ? Try eating a few soda crackers. ? Drink liquids between meals instead of during meals.  To prevent constipation: ? Eat foods that are high in fiber, like fresh fruits and vegetables, whole grains, and beans. ? Drink enough fluids to keep your pee (urine) clear or pale yellow. Activity  Exercise only as told by your doctor. Stop exercising if you have cramps or pain in your lower belly (abdomen) or low back.  Do not exercise if it is too hot, too humid, or if you are in a place of great height (high altitude).  Try to avoid standing for long periods of time. Move your legs often if you must stand in one place for a long time.  Avoid heavy lifting.  Wear low-heeled shoes. Sit and stand up  straight.  You can have sex unless your doctor tells you not to. Relieving pain and discomfort  Wear a good support bra if your breasts are sore.  Take warm water baths (sitz baths) to soothe pain or discomfort caused by hemorrhoids. Use hemorrhoid cream if your doctor says it is okay.  Rest with your legs raised if you have leg cramps or low back pain.  If you have puffy, bulging veins (varicose veins) in your legs: ? Wear support hose or compression stockings as told by your doctor. ? Raise (elevate) your feet for 15 minutes, 3-4 times a day. ? Limit salt in your food. Prenatal care  Schedule your prenatal visits by the twelfth week of pregnancy.  Write down your questions. Take them to your prenatal visits.  Keep all your prenatal visits as told by your doctor. This is important. Safety  Wear your seat belt at all times when driving.  Make a list of emergency phone numbers. The list should include numbers for family, friends, the hospital, and police and fire departments. General instructions  Ask your doctor for a referral to a local prenatal class. Begin classes no later than at the start of month 6 of your pregnancy.  Ask for help if you need counseling or if you need help with nutrition. Your doctor can give you advice or tell you where to go for help.  Do not use hot tubs, steam   rooms, or saunas.  Do not douche or use tampons or scented sanitary pads.  Do not cross your legs for long periods of time.  Avoid all herbs and alcohol. Avoid drugs that are not approved by your doctor.  Do not use any tobacco products, including cigarettes, chewing tobacco, and electronic cigarettes. If you need help quitting, ask your doctor. You may get counseling or other support to help you quit.  Avoid cat litter boxes and soil used by cats. These carry germs that can cause birth defects in the baby and can cause a loss of your baby (miscarriage) or stillbirth.  Visit your dentist.  At home, brush your teeth with a soft toothbrush. Be gentle when you floss. Contact a doctor if:  You are dizzy.  You have mild cramps or pressure in your lower belly.  You have a nagging pain in your belly area.  You continue to feel sick to your stomach, you throw up, or you have watery poop (diarrhea).  You have a bad smelling fluid coming from your vagina.  You have pain when you pee (urinate).  You have increased puffiness (swelling) in your face, hands, legs, or ankles. Get help right away if:  You have a fever.  You are leaking fluid from your vagina.  You have spotting or bleeding from your vagina.  You have very bad belly cramping or pain.  You gain or lose weight rapidly.  You throw up blood. It may look like coffee grounds.  You are around people who have German measles, fifth disease, or chickenpox.  You have a very bad headache.  You have shortness of breath.  You have any kind of trauma, such as from a fall or a car accident. Summary  The first trimester of pregnancy is from week 1 until the end of week 13 (months 1 through 3).  To take care of yourself and your unborn baby, you will need to eat healthy meals, take medicines only if your doctor tells you to do so, and do activities that are safe for you and your baby.  Keep all follow-up visits as told by your doctor. This is important as your doctor will have to ensure that your baby is healthy and growing well. This information is not intended to replace advice given to you by your health care provider. Make sure you discuss any questions you have with your health care provider. Document Revised: 02/19/2019 Document Reviewed: 11/06/2016 Elsevier Patient Education  2020 Elsevier Inc.  Safe Medications in Pregnancy   Acne: Benzoyl Peroxide Salicylic Acid  Backache/Headache: Tylenol: 2 regular strength every 4 hours OR              2 Extra strength every 6  hours  Colds/Coughs/Allergies: Benadryl (alcohol free) 25 mg every 6 hours as needed Breath right strips Claritin Cepacol throat lozenges Chloraseptic throat spray Cold-Eeze- up to three times per day Cough drops, alcohol free Flonase (by prescription only) Guaifenesin Mucinex Robitussin DM (plain only, alcohol free) Saline nasal spray/drops Sudafed (pseudoephedrine) & Actifed ** use only after [redacted] weeks gestation and if you do not have high blood pressure Tylenol Vicks Vaporub Zinc lozenges Zyrtec   Constipation: Colace Ducolax suppositories Fleet enema Glycerin suppositories Metamucil Milk of magnesia Miralax Senokot Smooth move tea  Diarrhea: Kaopectate Imodium A-D  *NO pepto Bismol  Hemorrhoids: Anusol Anusol HC Preparation H Tucks  Indigestion: Tums Maalox Mylanta Zantac  Pepcid  Insomnia: Benadryl (alcohol free) 25mg every 6 hours as needed   Tylenol PM Unisom, no Gelcaps  Leg Cramps: Tums MagGel  Nausea/Vomiting:  Bonine Dramamine Emetrol Ginger extract Sea bands Meclizine  Nausea medication to take during pregnancy:  Unisom (doxylamine succinate 25 mg tablets) Take one tablet daily at bedtime. If symptoms are not adequately controlled, the dose can be increased to a maximum recommended dose of two tablets daily (1/2 tablet in the morning, 1/2 tablet mid-afternoon and one at bedtime). Vitamin B6 100mg  tablets. Take one tablet twice a day (up to 200 mg per day).  Skin Rashes: Aveeno products Benadryl cream or 25mg  every 6 hours as needed Calamine Lotion 1% cortisone cream  Yeast infection: Gyne-lotrimin 7 Monistat 7   **If taking multiple medications, please check labels to avoid duplicating the same active ingredients **take medication as directed on the label ** Do not exceed 4000 mg of tylenol in 24 hours **Do not take medications that contain aspirin or ibuprofen    Prenatal Care Providers           Center for [facility]  Healthcare @ [facility]  [address] [phone]  Center for [facility] @ [facility]   [address]  [phone]  [facility] Healthcare @ [facility]       [address] [phone]            Center for [facility] Healthcare @ [place]     [phone]          Center for [facility] Healthcare @ [facility]   [phone]  Center for [facility] @ [facility]  [address] [phone]     Center for [facility] @ [address] [place])  [address]   [phone]     [facility]  Phone: [phone]  [facility]  Phone: [phone]  [facility] Phone: [phone]  [facility] Phone: [phone]  [facility] Phone: [phone]  [facility] Phone: [phone]  [facility]  Phone: [phone]

## 2020-11-08 LAB — GC/CHLAMYDIA PROBE AMP (~~LOC~~) NOT AT ARMC
Chlamydia: NEGATIVE
Comment: NEGATIVE
Comment: NORMAL
Neisseria Gonorrhea: NEGATIVE

## 2020-12-06 ENCOUNTER — Other Ambulatory Visit (HOSPITAL_COMMUNITY)
Admission: RE | Admit: 2020-12-06 | Discharge: 2020-12-06 | Disposition: A | Discharge status: Home / Self Care | Payer: Medicaid Other | Source: Ambulatory Visit | Attending: Nurse Practitioner | Admitting: Nurse Practitioner

## 2020-12-06 ENCOUNTER — Ambulatory Visit (INDEPENDENT_AMBULATORY_CARE_PROVIDER_SITE_OTHER): Payer: Medicaid Other | Admitting: Nurse Practitioner

## 2020-12-06 ENCOUNTER — Encounter: Payer: Self-pay | Admitting: Nurse Practitioner

## 2020-12-06 ENCOUNTER — Encounter: Payer: Self-pay | Admitting: General Practice

## 2020-12-06 ENCOUNTER — Other Ambulatory Visit: Payer: Self-pay

## 2020-12-06 VITALS — BP 120/72 | HR 72 | Wt 264.0 lb

## 2020-12-06 DIAGNOSIS — L732 Hidradenitis suppurativa: Secondary | ICD-10-CM

## 2020-12-06 DIAGNOSIS — Z6841 Body Mass Index (BMI) 40.0 and over, adult: Secondary | ICD-10-CM | POA: Insufficient documentation

## 2020-12-06 DIAGNOSIS — F332 Major depressive disorder, recurrent severe without psychotic features: Secondary | ICD-10-CM

## 2020-12-06 DIAGNOSIS — O0990 Supervision of high risk pregnancy, unspecified, unspecified trimester: Secondary | ICD-10-CM | POA: Insufficient documentation

## 2020-12-06 DIAGNOSIS — Z23 Encounter for immunization: Secondary | ICD-10-CM

## 2020-12-06 DIAGNOSIS — Z3A10 10 weeks gestation of pregnancy: Secondary | ICD-10-CM

## 2020-12-06 DIAGNOSIS — O0991 Supervision of high risk pregnancy, unspecified, first trimester: Secondary | ICD-10-CM | POA: Diagnosis not present

## 2020-12-06 DIAGNOSIS — Z98891 History of uterine scar from previous surgery: Secondary | ICD-10-CM

## 2020-12-06 NOTE — Progress Notes (Signed)
Subjective:   Charlotte [name] is a 25 y.o. G2P1001 at [redacted]w[redacted]d by early ultrasound being seen today for her first obstetrical visit.  Her obstetrical history is significant for obesity and previous C/S. Patient does intend to breast feed. Pregnancy history fully reviewed.  Patient reports no complaints.  HISTORY: OB History  Gravida Para Term Preterm AB Living  2 1 1  0 0 1  SAB IAB Ectopic Multiple Live Births  0 0 0 0 1    # Outcome Date GA Lbr Len/2nd Weight Sex Delivery Anes PTL Lv  2 Current           1 Term 05/02/15 [redacted]w[redacted]d  9 lb 4 oz (4.196 kg) F CS-LTranv EPI  LIV     Birth Comments: WNL     Apgar1: 8  Apgar5: 9   Past Medical History:  Diagnosis Date  . Asthma    given an inhaler 6th grade  . Obesity    Past Surgical History:  Procedure Laterality Date  . CESAREAN SECTION N/A 05/02/2015   Procedure: CESAREAN SECTION;  Surgeon: 05/04/2015, MD;  Location: [facility];  Service: Obstetrics;  Laterality: N/A;   Family History  Problem Relation Age of Onset  . Cancer Paternal Grandmother   . Diabetes Mother   . Diabetes Maternal Grandmother    Social History   Tobacco Use  . Smoking status: Former Smoker    Packs/day: 0.25    Types: Cigars  . Smokeless tobacco: Never Used  Substance Use Topics  . Alcohol use: Not Currently  . Drug use: Not Currently    Frequency: 7.0 times per week    Types: Marijuana    Comment: 11/03/2020   No Known Allergies Current Outpatient Medications on File Prior to Visit  Medication Sig Dispense Refill  . Prenatal Vit-Fe Phos-FA-Omega (VITAFOL GUMMIES) 3.33-0.333-34.8 MG CHEW Chew 1 each by mouth at bedtime. 30 tablet 3  . promethazine (PHENERGAN) 12.5 MG tablet Take 1 tablet (12.5 mg total) by mouth every 6 (six) hours as needed for nausea or vomiting. For nausea and vomiting not relieved by B6 and Unisom 30 tablet 0   No current facility-administered medications on file prior to visit.     Exam   Vitals:   12/06/20  1033  BP: 120/72  Pulse: 72  Weight: 264 lb (119.7 kg)      Uterus:  Fundal Height: 10 cm  Pelvic Exam: Perineum: no hemorrhoids, normal perineum   Vulva: Consistent appearance for history of hidradenitis    Vagina:  normal mucosa, normal discharge   Cervix: no lesions and normal, pap smear done.    Adnexa: normal adnexa and no mass, fullness, tenderness   Bony Pelvis: average  System: General: well-developed, well-nourished female in no acute distress   Breast:  normal appearance, no masses or tenderness   Skin: normal coloration and turgor, no rashes   Neurologic: oriented, normal, negative, normal mood   Extremities: normal strength, tone, and muscle mass, ROM of all joints is normal   HEENT extraocular movement intact and sclera clear, anicteric   Mouth/Teeth deferred   Neck supple and no masses, normal thyroid   Cardiovascular: regular rate and rhythm   Respiratory:  no respiratory distress, normal breath sounds   Abdomen: soft, non-tender; no masses,  no organomegaly     Assessment:   Pregnancy: G2P1001 Patient Active Problem List   Diagnosis Date Noted  . Supervision of high risk pregnancy, antepartum 12/06/2020  . 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 Obesity 10/18/2014  . Hidradenitis suppurativa 10/18/2014     Plan:  1. Supervision of high risk pregnancy, antepartum Doing well.  No problems at this time. Did not leave urine specimen today - cup was empty in the lab Advised no smoking, no drugs, no alcohol - reports she has stopped marijuana Advised to get Covid vaccine at 14 weeks Unable to hear FHT today with doppler.  Informal [place] by nurse showed baby moving but still unable to count FHT - client reassured - Will likely hear FHT at next visit. - Culture, OB Urine - CBC/D/Plt+RPR+Rh+ABO+Rub Ab... - [place] MFM OB DETAIL +14 WK; Future - Genetic Screening - Hemoglobin A1c - Cytology - PAP( [place]) - Flu  Vaccine QUAD 36+ mos IM - Babyscripts Schedule Optimization - Ambulatory referral to Integrated Behavioral Health  2. MDD (major depressive disorder), recurrent severe, without psychosis (HCC) Has been hospitalized previously for a couple of weeks - is not on medication and is not in therapy.  Did not express concern to provider but discussed with navigator symptoms of anxiety States she has a family member in [place] "fighting for their life with Covid" - Ambulatory referral to Integrated Behavioral Health  3. Hidradenitis suppurativa Scarring noted on upper thighs and genital area No infected areas noted  4. BMI 41 Advised of recommended weight gain of 11-20 pounds  5.  Previous C/S Will plan to meet with MD later in pregnancy to discuss C/S and VBAC options  Initial labs drawn. Continue prenatal vitamins. Genetic Screening discussed, NIPS: ordered. Ultrasound discussed; fetal anatomic survey: ordered. Problem list reviewed and updated. The nature of [facility] with multiple MDs and other Advanced Practice Providers was explained to patient; also emphasized that residents, students are part of our team. Routine obstetric precautions reviewed. Return in about 4 weeks (around 01/03/2021) for in person ROB.  Total face-to-face time with patient: 40 minutes.  Over 50% of encounter was spent on counseling and coordination of care.     Charlotte Bernheim, FNP Family Nurse Practitioner, [facility] for [employer], [facility] 12/06/2020 1:07 PM

## 2020-12-06 NOTE — Patient Instructions (Addendum)
https://www.cdc.gov/pregnancy/infections.html">  First Trimester of Pregnancy  The first trimester of pregnancy starts on the first day of your last menstrual period until the end of week 12. This is also called months 1 through 3 of pregnancy. Body changes during your first trimester Your body goes through many changes during pregnancy. The changes usually return to normal after your baby is born. Physical changes  You may gain or lose weight.  Your breasts may grow larger and hurt. The area around your nipples may get darker.  Dark spots or blotches may develop on your face.  You may have changes in your hair. Health changes  You may feel like you might vomit (nauseous), and you may vomit.  You may have heartburn.  You may have headaches.  You may have trouble pooping (constipation).  Your gums may bleed. Other changes  You may get tired easily.  You may pee (urinate) more often.  Your menstrual periods will stop.  You may not feel hungry.  You may want to eat certain kinds of food.  You may have changes in your emotions from day to day.  You may have more dreams. Follow these instructions at home: Medicines  Take over-the-counter and prescription medicines only as told by your doctor. Some medicines are not safe during pregnancy.  Take a prenatal vitamin that contains at least 600 micrograms (mcg) of folic acid. Eating and drinking  Eat healthy meals that include: ? Fresh fruits and vegetables. ? Whole grains. ? Good sources of protein, such as meat, eggs, or tofu. ? Low-fat dairy products.  Avoid raw meat and unpasteurized juice, milk, and cheese.  If you feel like you may vomit, or you vomit: ? Eat 4 or 5 small meals a day instead of 3 large meals. ? Try eating a few soda crackers. ? Drink liquids between meals instead of during meals.  You may need to take these actions to prevent or treat trouble pooping: ? Drink enough fluids to keep your pee  (urine) pale yellow. ? Eat foods that are high in fiber. These include beans, whole grains, and fresh fruits and vegetables. ? Limit foods that are high in fat and sugar. These include fried or sweet foods. Activity  Exercise only as told by your doctor. Most people can do their usual exercise routine during pregnancy.  Stop exercising if you have cramps or pain in your lower belly (abdomen) or low back.  Do not exercise if it is too hot or too humid, or if you are in a place of great height (high altitude).  Avoid heavy lifting.  If you choose to, you may have sex unless your doctor tells you not to. Relieving pain and discomfort  Wear a good support bra if your breasts are sore.  Rest with your legs raised (elevated) if you have leg cramps or low back pain.  If you have bulging veins (varicose veins) in your legs: ? Wear support hose as told by your doctor. ? Raise your feet for 15 minutes, 3-4 times a day. ? Limit salt in your food. Safety  Wear your seat belt at all times when you are in a car.  Talk with your doctor if someone is hurting you or yelling at you.  Talk with your doctor if you are feeling sad or have thoughts of hurting yourself. Lifestyle  Do not use hot tubs, steam rooms, or saunas.  Do not douche. Do not use tampons or scented sanitary pads.  Do not   use herbal medicines, illegal drugs, or medicines that are not approved by your doctor. Do not drink alcohol.  Do not smoke or use any products that contain nicotine or tobacco. If you need help quitting, ask your doctor.  Avoid cat litter boxes and soil that is used by cats. These carry germs that can cause harm to the baby and can cause a loss of your baby by miscarriage or stillbirth. General instructions  Keep all follow-up visits. This is important.  Ask for help if you need counseling or if you need help with nutrition. Your doctor can give you advice or tell you where to go for help.  Visit your  dentist. At home, brush your teeth with a soft toothbrush. Floss gently.  Write down your questions. Take them to your prenatal visits. Where to find more information  American Pregnancy Association: americanpregnancy.org  American College of Obstetricians and Gynecologists: www.acog.org  Office on Women's Health: womenshealth.gov/pregnancy Contact a doctor if:  You are dizzy.  You have a fever.  You have mild cramps or pressure in your lower belly.  You have a nagging pain in your belly area.  You continue to feel like you may vomit, you vomit, or you have watery poop (diarrhea) for 24 hours or longer.  You have a bad-smelling fluid coming from your vagina.  You have pain when you pee.  You are exposed to a disease that spreads from person to person, such as chickenpox, measles, Zika virus, HIV, or hepatitis. Get help right away if:  You have spotting or bleeding from your vagina.  You have very bad belly cramping or pain.  You have shortness of breath or chest pain.  You have any kind of injury, such as from a fall or a car crash.  You have new or increased pain, swelling, or redness in an arm or leg. Summary  The first trimester of pregnancy starts on the first day of your last menstrual period until the end of week 12 (months 1 through 3).  Eat 4 or 5 small meals a day instead of 3 large meals.  Do not smoke or use any products that contain nicotine or tobacco. If you need help quitting, ask your doctor.  Keep all follow-up visits. This information is not intended to replace advice given to you by your health care provider. Make sure you discuss any questions you have with your health care provider. Document Revised: 04/06/2020 Document Reviewed: 02/11/2020 Elsevier Patient Education  2021 Elsevier Inc.  

## 2020-12-06 NOTE — Progress Notes (Signed)
patient left an empty urine container in window unable to send off culture

## 2020-12-07 LAB — CBC/D/PLT+RPR+RH+ABO+RUB AB...
Antibody Screen: NEGATIVE
Basophils Absolute: 0.1 10*3/uL (ref 0.0–0.2)
Basos: 1 %
EOS (ABSOLUTE): 0.3 10*3/uL (ref 0.0–0.4)
Eos: 3 %
HCV Ab: 0.1 s/co ratio (ref 0.0–0.9)
HIV Screen 4th Generation wRfx: NONREACTIVE
Hematocrit: 38.6 % (ref 34.0–46.6)
Hemoglobin: 13.2 g/dL (ref 11.1–15.9)
Hepatitis B Surface Ag: NEGATIVE
Immature Grans (Abs): 0 10*3/uL (ref 0.0–0.1)
Immature Granulocytes: 0 %
Lymphocytes Absolute: 3 10*3/uL (ref 0.7–3.1)
Lymphs: 27 %
MCH: 31.1 pg (ref 26.6–33.0)
MCHC: 34.2 g/dL (ref 31.5–35.7)
MCV: 91 fL (ref 79–97)
Monocytes Absolute: 0.7 10*3/uL (ref 0.1–0.9)
Monocytes: 6 %
Neutrophils Absolute: 6.9 10*3/uL (ref 1.4–7.0)
Neutrophils: 63 %
Platelets: 310 10*3/uL (ref 150–450)
RBC: 4.24 x10E6/uL (ref 3.77–5.28)
RDW: 11.8 % (ref 11.7–15.4)
RPR Ser Ql: NONREACTIVE
Rh Factor: POSITIVE
Rubella Antibodies, IGG: 1.01 index (ref >0.99)
WBC: 11 10*3/uL — ABNORMAL HIGH (ref 3.4–10.8)

## 2020-12-07 LAB — CYTOLOGY - PAP
Chlamydia: NEGATIVE
Comment: NEGATIVE
Comment: NORMAL
Diagnosis: NEGATIVE
Neisseria Gonorrhea: NEGATIVE

## 2020-12-07 LAB — HEMOGLOBIN A1C
Est. average glucose Bld gHb Est-mCnc: 108 mg/dL
Hgb A1c MFr Bld: 5.4 % (ref 4.8–5.6)

## 2020-12-07 LAB — HCV INTERPRETATION

## 2020-12-07 NOTE — BH Specialist Note (Addendum)
Integrated Behavioral Health via Telemedicine Visit  12/07/2020 Charlotte [name] [id number]  Pt did not arrive to video visit and did not answer the phone ; Voicemail not set up so unable to leave voice message. ; left MyChart message for patient.

## 2020-12-20 ENCOUNTER — Ambulatory Visit: Payer: Medicaid Other | Admitting: Clinical

## 2020-12-20 DIAGNOSIS — Z91199 Patient's noncompliance with other medical treatment and regimen due to unspecified reason: Secondary | ICD-10-CM

## 2020-12-20 DIAGNOSIS — Z5329 Procedure and treatment not carried out because of patient's decision for other reasons: Secondary | ICD-10-CM

## 2020-12-26 ENCOUNTER — Encounter: Payer: Self-pay | Admitting: Family Medicine

## 2020-12-27 ENCOUNTER — Telehealth: Payer: Self-pay

## 2020-12-28 ENCOUNTER — Encounter: Payer: Self-pay | Admitting: Nurse Practitioner

## 2020-12-28 ENCOUNTER — Telehealth: Payer: Self-pay

## 2020-12-28 ENCOUNTER — Other Ambulatory Visit: Payer: Self-pay | Admitting: Nurse Practitioner

## 2020-12-28 DIAGNOSIS — Z141 Cystic fibrosis carrier: Secondary | ICD-10-CM

## 2020-12-28 NOTE — Telephone Encounter (Signed)
Called Pt to go over [employer] regarding being a carrier for Cystic Fibrosis, Pt advised has already been contacted by [place] for [employer].

## 2020-12-28 NOTE — Progress Notes (Signed)
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  Nolene Bernheim, RN, MSN, NP-BC Nurse Practitioner, [facility] for [employer], [facility] 12/28/2020 8:14 AM

## 2021-01-02 NOTE — Telephone Encounter (Signed)
Opened in error

## 2021-01-03 ENCOUNTER — Encounter: Payer: Medicaid Other | Admitting: Nurse Practitioner

## 2021-01-05 ENCOUNTER — Other Ambulatory Visit: Payer: Self-pay

## 2021-01-05 ENCOUNTER — Ambulatory Visit (INDEPENDENT_AMBULATORY_CARE_PROVIDER_SITE_OTHER): Payer: Medicaid Other | Admitting: Student

## 2021-01-05 VITALS — BP 120/65 | HR 74 | Wt 266.0 lb

## 2021-01-05 DIAGNOSIS — Z3A14 14 weeks gestation of pregnancy: Secondary | ICD-10-CM

## 2021-01-05 DIAGNOSIS — O0990 Supervision of high risk pregnancy, unspecified, unspecified trimester: Secondary | ICD-10-CM

## 2021-01-05 NOTE — Progress Notes (Signed)
Patient ID: [name], female   DOB: [birth year], 25 y.o.   MRN: [id number]   PRENATAL VISIT NOTE  Subjective:  Charlotte [name] is a 25 y.o. G2P1001 at [redacted]w[redacted]d being seen today for ongoing prenatal care.  She is currently monitored for the following issues for this low-risk pregnancy and has Obesity; Hidradenitis suppurativa; Previous cesarean section; MDD (major depressive disorder), recurrent severe, without psychosis (HCC); Supervision of high risk pregnancy, antepartum; BMI 40.0-44.9, adult (HCC); and Cystic fibrosis carrier on their problem list.  Patient reports no complaints.  Contractions: Not present. Vag. Bleeding: None.  Movement: Absent. Denies leaking of fluid.   The following portions of the patient's history were reviewed and updated as appropriate: allergies, current medications, past family history, past medical history, past social history, past surgical history and problem list.   Objective:   Vitals:   01/05/21 1032  BP: 120/65  Pulse: 74  Weight: 266 lb (120.7 kg)    Fetal Status: Fetal Heart Rate (bpm): 130   Movement: Absent     General:  Alert, oriented and cooperative. Patient is in no acute distress.  Skin: Skin is warm and dry. No rash noted.   Cardiovascular: Normal heart rate noted  Respiratory: Normal respiratory effort, no problems with respiration noted  Abdomen: Soft, gravid, appropriate for gestational age.  Pain/Pressure: Present     Pelvic: Cervical exam deferred        Extremities: Normal range of motion.  Edema: None  Mental Status: Normal mood and affect. Normal behavior. Normal judgment and thought content.   Assessment and Plan:  Pregnancy: G2P1001 at [redacted]w[redacted]d 1. Supervision of high risk pregnancy, antepartum -will discuss VBAC in the future - Culture, OB Urine; Future  2. [redacted] weeks gestation of pregnancy   Preterm labor symptoms and general obstetric precautions including but not limited to vaginal bleeding, contractions, leaking of  fluid and fetal movement were reviewed in detail with the patient. Please refer to After Visit Summary for other counseling recommendations.   Return in about 4 weeks (around 02/02/2021), or LROB on My chart with KK.  Future Appointments  Date Time Provider Department Center  02/02/2021  9:55 AM Judeth Horn, NP [facility]  02/03/2021  9:30 AM [facility] NURSE [facility]  02/03/2021  9:45 AM [facility]    Marylene Land, CNM

## 2021-01-05 NOTE — Progress Notes (Signed)
   PRENATAL VISIT NOTE  Subjective:  Charlotte [name] is a 25 y.o. G2P1001 at [redacted]w[redacted]d being seen today for ongoing prenatal care.  She is currently monitored for the following issues for this low-risk pregnancy and has Obesity; Hidradenitis suppurativa; Previous cesarean section; MDD (major depressive disorder), recurrent severe, without psychosis ([place]); Supervision of high risk pregnancy, antepartum; BMI 40.0-44.9, adult ([place]); and Cystic fibrosis carrier on their problem list.  Patient reports no bleeding, no contractions, and no cramping.. Patient reports intermittent back pain and fatigue. Contractions: Not present. Vag. Bleeding: None.  Movement: Absent. Denies leaking of fluid.   The following portions of the patient's history were reviewed and updated as appropriate: allergies, current medications, past family history, past medical history, past social history, past surgical history and problem list.   Objective:   Vitals:   01/05/21 1032  BP: 120/65  Pulse: 74  Weight: 266 lb (120.7 kg)    Fetal Status: Fetal Heart Rate (bpm): 130   Movement: Absent     General:  Alert, oriented and cooperative. Patient is in no acute distress.  Skin: Skin is warm and dry. No rash noted.   Cardiovascular: Normal heart rate noted  Respiratory: Normal respiratory effort, no problems with respiration noted  Abdomen: Soft, gravid, appropriate for gestational age.  Pain/Pressure: Present     Pelvic: Cervical exam deferred        Extremities: Normal range of motion.  Edema: None  Mental Status: Normal mood and affect. Normal behavior. Normal judgment and thought content.   Assessment and Plan:  Pregnancy: G2P1001 at [redacted]w[redacted]d 1. Supervision of high risk pregnancy, antepartum -Patient states she would like to plan on VBAC if she is a candidate, previous pregnancy 5 years ago had CS due to failed induction of labor and cervical dilation stalling at 6cm -Patient requested and received a cystic fibrosis  testing kit for partner, was inquiring about testing fetus as well and received education about amniocentesis. Patient declined at this time and will test partner. -Patient plans on Mirena post placental delivery -Needs fetal anatomy scan rescheduled due to recent employment -Some interest in prenatal classes, would like to touch base at next appointment - Urine culture pending  Preterm labor symptoms and general obstetric precautions including but not limited to vaginal bleeding, contractions, leaking of fluid and fetal movement were reviewed in detail with the patient. Please refer to After Visit Summary for other counseling recommendations.   Return in about 4 weeks (around 02/02/2021), or LROB on My chart with [place].  Future Appointments  Date Time Provider [place]  02/02/2021  9:55 AM Jorje Guild, NP [facility]  02/03/2021  9:30 AM [facility] NURSE [facility]  02/03/2021  9:45 AM [facility] US4 [facility]    Wendall Papa, [place]

## 2021-01-06 ENCOUNTER — Encounter: Payer: Self-pay | Admitting: *Deleted

## 2021-02-02 ENCOUNTER — Telehealth (INDEPENDENT_AMBULATORY_CARE_PROVIDER_SITE_OTHER): Payer: Medicaid Other

## 2021-02-02 VITALS — BP 129/80 | HR 77

## 2021-02-02 DIAGNOSIS — O0990 Supervision of high risk pregnancy, unspecified, unspecified trimester: Secondary | ICD-10-CM

## 2021-02-02 DIAGNOSIS — O99892 Other specified diseases and conditions complicating childbirth: Secondary | ICD-10-CM

## 2021-02-02 DIAGNOSIS — Z3A18 18 weeks gestation of pregnancy: Secondary | ICD-10-CM

## 2021-02-02 DIAGNOSIS — R682 Dry mouth, unspecified: Secondary | ICD-10-CM

## 2021-02-02 NOTE — Progress Notes (Signed)
I connected with  Charlotte [name] on 02/02/21 at  9:55 AM EDT by telephone and verified that I am speaking with the correct person using two identifiers.   I discussed the limitations, risks, security and privacy concerns of performing an evaluation and management service by telephone and the availability of in person appointments. I also discussed with the patient that there may be a patient responsible charge related to this service. The patient expressed understanding and agreed to proceed.  Ralene Bathe, RN 02/02/2021  10:12 AM

## 2021-02-02 NOTE — Progress Notes (Signed)
OBSTETRICS PRENATAL VIRTUAL VISIT ENCOUNTER NOTE  Provider location: Center for [facility]   Patient location: Home  I connected with Charlotte [name] on 02/02/21 at  9:55 AM EDT by MyChart Video Encounter and verified that I am speaking with the correct person using two identifiers.   I discussed the limitations, risks, security and privacy concerns of performing an evaluation and management service virtually and the availability of in person appointments. I also discussed with the patient that there may be a patient responsible charge related to this service. The patient expressed understanding and agreed to proceed. Subjective:  Charlotte [name] is a 25 y.o. G2P1001 at [redacted]w[redacted]d being seen today for ongoing prenatal care.  She is currently monitored for the following issues for this low-risk pregnancy and has Obesity; Hidradenitis suppurativa; Previous cesarean section; MDD (major depressive disorder), recurrent severe, without psychosis (HCC); Supervision of high risk pregnancy, antepartum; BMI 40.0-44.9, adult (HCC); and Cystic fibrosis carrier on their problem list.  Patient reports "craving for ice water, but not feeling like it is quenching my thirst." She states she drinks plenty of water and is not taking any medications.  She states she feels that her lips are starting to blister. She also states she has had incidents of lightheadedness were she feels like she needs to "snap back."  She states she works 6 hours daily, but is not allowed to snack during her shift.  She drinks 2-3 water bottles at work and continues to drink after work.  Contractions: Not present. Vag. Bleeding: None.  Movement: Present. Denies any leaking of fluid.   The following portions of the patient's history were reviewed and updated as appropriate: allergies, current medications, past family history, past medical history, past social history, past surgical history and problem list.    Objective:   Vitals:   02/02/21 1104  BP: 129/80  Pulse: 77    Fetal Status:     Movement: Present     General:  Alert, oriented and cooperative. Patient is in no acute distress.  Respiratory: Normal respiratory effort, no problems with respiration noted  Mental Status: Normal mood and affect. Normal behavior. Normal judgment and thought content.  Rest of physical exam deferred due to type of encounter  Imaging: No results found.  Assessment and Plan:  Pregnancy: G2P1001 at [redacted]w[redacted]d 1. Supervision of high risk pregnancy, antepartum -Anticipatory guidance for upcoming appts. -Patient to next appt in 4-5 weeks for a virtual visit. -Patient requests to have all appts on Tues or Thursdays.  Informed that we will accommodate accordingly.   2. [redacted] weeks gestation of pregnancy -Addressed complaints. -Patient requests and given work lifting restrictions.  Plans to pick up after her anatomy US.  3. Mouth dryness -Discussed possible causes of dry mouth including medications, improper hydration, and diabetes. -Informed that GTT would occur in late 2nd/early 3rd trimester. -Encouraged continued hydration. -Discussed usage of chap sticks, sugar-free gum and/or hard candy as well as mouth rinses without alcohol. -Encouraged to report worsening of symptoms.   Preterm labor symptoms and general obstetric precautions including but not limited to vaginal bleeding, contractions, leaking of fluid and fetal movement were reviewed in detail with the patient. I discussed the assessment and treatment plan with the patient. The patient was provided an opportunity to ask questions and all were answered. The patient agreed with the plan and demonstrated an understanding of the instructions. The patient was advised to call back or seek an in-person office evaluation/go to  MAU at [facility] for any urgent or concerning symptoms. Please refer to After Visit Summary for other counseling  recommendations.   I provided 12 minutes of face-to-face time during this encounter.  Return in about 4 weeks (around 03/02/2021) for Virtual LR-ROB.  Future Appointments  Date Time Provider Department Center  02/03/2021  9:30 AM [facility] NURSE [facility]  02/03/2021  9:45 AM [facility] US4 [facility]    Cherre Robins, CNM Center for [employer], [facility]

## 2021-02-03 ENCOUNTER — Other Ambulatory Visit: Payer: Self-pay

## 2021-02-03 ENCOUNTER — Encounter: Payer: Self-pay | Admitting: *Deleted

## 2021-02-03 ENCOUNTER — Other Ambulatory Visit: Payer: Self-pay | Admitting: *Deleted

## 2021-02-03 ENCOUNTER — Ambulatory Visit: Payer: Medicaid Other | Admitting: *Deleted

## 2021-02-03 ENCOUNTER — Ambulatory Visit: Payer: Medicaid Other | Attending: Nurse Practitioner

## 2021-02-03 DIAGNOSIS — O34219 Maternal care for unspecified type scar from previous cesarean delivery: Secondary | ICD-10-CM

## 2021-02-03 DIAGNOSIS — O0990 Supervision of high risk pregnancy, unspecified, unspecified trimester: Secondary | ICD-10-CM | POA: Insufficient documentation

## 2021-02-03 DIAGNOSIS — Z3A19 19 weeks gestation of pregnancy: Secondary | ICD-10-CM | POA: Diagnosis not present

## 2021-02-03 DIAGNOSIS — Z362 Encounter for other antenatal screening follow-up: Secondary | ICD-10-CM

## 2021-02-03 DIAGNOSIS — O99212 Obesity complicating pregnancy, second trimester: Secondary | ICD-10-CM

## 2021-02-03 DIAGNOSIS — Z141 Cystic fibrosis carrier: Secondary | ICD-10-CM

## 2021-02-06 ENCOUNTER — Encounter: Payer: Self-pay | Admitting: *Deleted

## 2021-02-21 ENCOUNTER — Telehealth: Payer: Self-pay | Admitting: Lactation Services

## 2021-02-21 NOTE — Telephone Encounter (Signed)
Called patient to inform her that [name] is negative for Cystic Fibrosis. Patient did not answer and voicemail box not set up. My Chart message sent.

## 2021-02-22 ENCOUNTER — Telehealth: Payer: Self-pay

## 2021-02-22 NOTE — Telephone Encounter (Signed)
Attempted to contact pt in regards to concern of the work restriction letter.  The number listed is not a working number.    Charlotte [name]  02/22/21

## 2021-02-23 ENCOUNTER — Encounter: Payer: Self-pay | Admitting: *Deleted

## 2021-02-28 ENCOUNTER — Encounter: Payer: Self-pay | Admitting: General Practice

## 2021-03-07 ENCOUNTER — Encounter: Payer: Self-pay | Admitting: *Deleted

## 2021-03-07 ENCOUNTER — Ambulatory Visit: Payer: Medicaid Other | Attending: Obstetrics and Gynecology

## 2021-03-07 ENCOUNTER — Other Ambulatory Visit: Payer: Self-pay | Admitting: *Deleted

## 2021-03-07 ENCOUNTER — Ambulatory Visit: Payer: Medicaid Other | Admitting: *Deleted

## 2021-03-07 ENCOUNTER — Other Ambulatory Visit: Payer: Self-pay

## 2021-03-07 DIAGNOSIS — Z6841 Body Mass Index (BMI) 40.0 and over, adult: Secondary | ICD-10-CM

## 2021-03-07 DIAGNOSIS — O0990 Supervision of high risk pregnancy, unspecified, unspecified trimester: Secondary | ICD-10-CM

## 2021-03-07 DIAGNOSIS — Z362 Encounter for other antenatal screening follow-up: Secondary | ICD-10-CM | POA: Insufficient documentation

## 2021-04-03 ENCOUNTER — Encounter: Payer: Self-pay | Admitting: *Deleted

## 2021-04-03 ENCOUNTER — Other Ambulatory Visit: Payer: Self-pay

## 2021-04-03 ENCOUNTER — Other Ambulatory Visit: Payer: Self-pay | Admitting: Obstetrics and Gynecology

## 2021-04-03 ENCOUNTER — Ambulatory Visit: Payer: Medicaid Other | Attending: Obstetrics and Gynecology

## 2021-04-03 ENCOUNTER — Ambulatory Visit: Payer: Medicaid Other | Admitting: *Deleted

## 2021-04-03 DIAGNOSIS — Z3A27 27 weeks gestation of pregnancy: Secondary | ICD-10-CM

## 2021-04-03 DIAGNOSIS — O34219 Maternal care for unspecified type scar from previous cesarean delivery: Secondary | ICD-10-CM

## 2021-04-03 DIAGNOSIS — Z6841 Body Mass Index (BMI) 40.0 and over, adult: Secondary | ICD-10-CM | POA: Diagnosis present

## 2021-04-03 DIAGNOSIS — Z141 Cystic fibrosis carrier: Secondary | ICD-10-CM

## 2021-04-03 DIAGNOSIS — Z362 Encounter for other antenatal screening follow-up: Secondary | ICD-10-CM

## 2021-04-03 DIAGNOSIS — O99212 Obesity complicating pregnancy, second trimester: Secondary | ICD-10-CM

## 2021-04-03 DIAGNOSIS — O0990 Supervision of high risk pregnancy, unspecified, unspecified trimester: Secondary | ICD-10-CM

## 2021-04-03 DIAGNOSIS — O99213 Obesity complicating pregnancy, third trimester: Secondary | ICD-10-CM

## 2021-05-05 ENCOUNTER — Other Ambulatory Visit: Payer: Self-pay

## 2021-05-05 ENCOUNTER — Encounter: Payer: Self-pay | Admitting: *Deleted

## 2021-05-05 ENCOUNTER — Ambulatory Visit: Payer: Medicaid Other | Attending: Obstetrics and Gynecology

## 2021-05-05 ENCOUNTER — Ambulatory Visit: Payer: Medicaid Other | Admitting: *Deleted

## 2021-05-05 VITALS — BP 116/66 | HR 88

## 2021-05-05 DIAGNOSIS — O0990 Supervision of high risk pregnancy, unspecified, unspecified trimester: Secondary | ICD-10-CM

## 2021-05-05 DIAGNOSIS — O09893 Supervision of other high risk pregnancies, third trimester: Secondary | ICD-10-CM

## 2021-05-05 DIAGNOSIS — O99213 Obesity complicating pregnancy, third trimester: Secondary | ICD-10-CM | POA: Insufficient documentation

## 2021-05-05 DIAGNOSIS — E669 Obesity, unspecified: Secondary | ICD-10-CM | POA: Diagnosis not present

## 2021-05-05 DIAGNOSIS — O34219 Maternal care for unspecified type scar from previous cesarean delivery: Secondary | ICD-10-CM

## 2021-05-05 DIAGNOSIS — Z362 Encounter for other antenatal screening follow-up: Secondary | ICD-10-CM | POA: Diagnosis not present

## 2021-05-05 DIAGNOSIS — Z3A32 32 weeks gestation of pregnancy: Secondary | ICD-10-CM

## 2021-05-08 ENCOUNTER — Other Ambulatory Visit: Payer: Self-pay | Admitting: *Deleted

## 2021-05-08 DIAGNOSIS — Z6841 Body Mass Index (BMI) 40.0 and over, adult: Secondary | ICD-10-CM

## 2021-06-02 ENCOUNTER — Ambulatory Visit: Payer: Medicaid Other | Attending: Maternal & Fetal Medicine

## 2021-06-02 ENCOUNTER — Encounter: Payer: Self-pay | Admitting: *Deleted

## 2021-06-02 ENCOUNTER — Ambulatory Visit: Payer: Medicaid Other | Admitting: *Deleted

## 2021-06-02 ENCOUNTER — Other Ambulatory Visit: Payer: Self-pay

## 2021-06-02 VITALS — BP 139/76 | HR 86

## 2021-06-02 DIAGNOSIS — O34219 Maternal care for unspecified type scar from previous cesarean delivery: Secondary | ICD-10-CM

## 2021-06-02 DIAGNOSIS — Z6841 Body Mass Index (BMI) 40.0 and over, adult: Secondary | ICD-10-CM | POA: Diagnosis not present

## 2021-06-02 DIAGNOSIS — O0990 Supervision of high risk pregnancy, unspecified, unspecified trimester: Secondary | ICD-10-CM

## 2021-06-02 DIAGNOSIS — E669 Obesity, unspecified: Secondary | ICD-10-CM | POA: Diagnosis not present

## 2021-06-02 DIAGNOSIS — Z362 Encounter for other antenatal screening follow-up: Secondary | ICD-10-CM

## 2021-06-02 DIAGNOSIS — O99213 Obesity complicating pregnancy, third trimester: Secondary | ICD-10-CM | POA: Diagnosis not present

## 2021-06-02 DIAGNOSIS — Z3A36 36 weeks gestation of pregnancy: Secondary | ICD-10-CM

## 2021-06-02 DIAGNOSIS — Z141 Cystic fibrosis carrier: Secondary | ICD-10-CM

## 2021-06-05 ENCOUNTER — Other Ambulatory Visit: Payer: Self-pay | Admitting: *Deleted

## 2021-06-05 DIAGNOSIS — Z6841 Body Mass Index (BMI) 40.0 and over, adult: Secondary | ICD-10-CM

## 2021-06-12 ENCOUNTER — Other Ambulatory Visit: Payer: Self-pay

## 2021-06-12 ENCOUNTER — Other Ambulatory Visit: Payer: Self-pay | Admitting: Maternal & Fetal Medicine

## 2021-06-12 ENCOUNTER — Ambulatory Visit: Payer: Medicaid Other | Attending: Maternal & Fetal Medicine

## 2021-06-12 ENCOUNTER — Encounter: Payer: Self-pay | Admitting: *Deleted

## 2021-06-12 ENCOUNTER — Ambulatory Visit: Payer: Medicaid Other | Admitting: *Deleted

## 2021-06-12 VITALS — BP 135/71 | HR 72

## 2021-06-12 DIAGNOSIS — O0990 Supervision of high risk pregnancy, unspecified, unspecified trimester: Secondary | ICD-10-CM

## 2021-06-12 DIAGNOSIS — Z141 Cystic fibrosis carrier: Secondary | ICD-10-CM | POA: Insufficient documentation

## 2021-06-12 DIAGNOSIS — Z98891 History of uterine scar from previous surgery: Secondary | ICD-10-CM | POA: Insufficient documentation

## 2021-06-12 DIAGNOSIS — O09893 Supervision of other high risk pregnancies, third trimester: Secondary | ICD-10-CM | POA: Insufficient documentation

## 2021-06-12 DIAGNOSIS — Z6841 Body Mass Index (BMI) 40.0 and over, adult: Secondary | ICD-10-CM | POA: Diagnosis present

## 2021-06-12 DIAGNOSIS — O99213 Obesity complicating pregnancy, third trimester: Secondary | ICD-10-CM

## 2021-06-19 ENCOUNTER — Other Ambulatory Visit: Payer: Self-pay | Admitting: *Deleted

## 2021-06-19 ENCOUNTER — Encounter: Payer: Self-pay | Admitting: *Deleted

## 2021-06-19 ENCOUNTER — Ambulatory Visit: Payer: Medicaid Other | Attending: Maternal & Fetal Medicine

## 2021-06-19 ENCOUNTER — Ambulatory Visit: Payer: Medicaid Other | Admitting: *Deleted

## 2021-06-19 ENCOUNTER — Other Ambulatory Visit: Payer: Self-pay

## 2021-06-19 ENCOUNTER — Other Ambulatory Visit: Payer: Self-pay | Admitting: Maternal & Fetal Medicine

## 2021-06-19 VITALS — BP 138/72 | HR 81

## 2021-06-19 DIAGNOSIS — O0990 Supervision of high risk pregnancy, unspecified, unspecified trimester: Secondary | ICD-10-CM | POA: Insufficient documentation

## 2021-06-19 DIAGNOSIS — R638 Other symptoms and signs concerning food and fluid intake: Secondary | ICD-10-CM

## 2021-06-19 DIAGNOSIS — Z6841 Body Mass Index (BMI) 40.0 and over, adult: Secondary | ICD-10-CM | POA: Diagnosis present

## 2021-06-19 DIAGNOSIS — O99213 Obesity complicating pregnancy, third trimester: Secondary | ICD-10-CM | POA: Diagnosis present

## 2021-06-19 DIAGNOSIS — Z3A38 38 weeks gestation of pregnancy: Secondary | ICD-10-CM | POA: Insufficient documentation

## 2021-06-20 ENCOUNTER — Encounter: Payer: Self-pay | Admitting: *Deleted

## 2021-06-20 ENCOUNTER — Ambulatory Visit (INDEPENDENT_AMBULATORY_CARE_PROVIDER_SITE_OTHER): Payer: Medicaid Other | Admitting: Family Medicine

## 2021-06-20 ENCOUNTER — Other Ambulatory Visit (HOSPITAL_COMMUNITY)
Admission: RE | Admit: 2021-06-20 | Discharge: 2021-06-20 | Disposition: A | Discharge status: Home / Self Care | Payer: Medicaid Other | Source: Ambulatory Visit | Attending: Nurse Practitioner | Admitting: Nurse Practitioner

## 2021-06-20 ENCOUNTER — Encounter (HOSPITAL_COMMUNITY): Payer: Self-pay

## 2021-06-20 ENCOUNTER — Telehealth: Payer: Self-pay | Admitting: *Deleted

## 2021-06-20 VITALS — BP 130/85 | HR 79 | Wt 303.2 lb

## 2021-06-20 DIAGNOSIS — Z23 Encounter for immunization: Secondary | ICD-10-CM

## 2021-06-20 DIAGNOSIS — O0990 Supervision of high risk pregnancy, unspecified, unspecified trimester: Secondary | ICD-10-CM | POA: Diagnosis present

## 2021-06-20 DIAGNOSIS — Z98891 History of uterine scar from previous surgery: Secondary | ICD-10-CM

## 2021-06-20 DIAGNOSIS — O99210 Obesity complicating pregnancy, unspecified trimester: Secondary | ICD-10-CM

## 2021-06-20 NOTE — Progress Notes (Signed)
   Subjective:  Charlotte [name] is a 25 y.o. G2P1001 at [redacted]w[redacted]d being seen today for ongoing prenatal care.  She is currently monitored for the following issues for this high-risk pregnancy and has Obesity; Hidradenitis suppurativa; Obesity in pregnancy, antepartum; Previous cesarean section; MDD (major depressive disorder), recurrent severe, without psychosis (HCC); Supervision of high risk pregnancy, antepartum; BMI 40.0-44.9, adult (HCC); and Cystic fibrosis carrier on their problem list.  Patient reports no complaints.  Contractions: Not present. Vag. Bleeding: None.  Movement: Present. Denies leaking of fluid.   The following portions of the patient's history were reviewed and updated as appropriate: allergies, current medications, past family history, past medical history, past social history, past surgical history and problem list. Problem list updated.  Objective:   Vitals:   06/20/21 1122  BP: 130/85  Pulse: 79  Weight: (!) 303 lb 3.2 oz (137.5 kg)    Fetal Status:     Movement: Present     General:  Alert, oriented and cooperative. Patient is in no acute distress.  Skin: Skin is warm and dry. No rash noted.   Cardiovascular: Normal heart rate noted  Respiratory: Normal respiratory effort, no problems with respiration noted  Abdomen: Soft, gravid, appropriate for gestational age. Pain/Pressure: Present     Pelvic: Vag. Bleeding: None     Cervical exam deferred        Extremities: Normal range of motion.     Mental Status: Normal mood and affect. Normal behavior. Normal judgment and thought content.   Urinalysis:      Assessment and Plan:  Pregnancy: G2P1001 at [redacted]w[redacted]d  1. Supervision of high risk pregnancy, antepartum BP and FHR normal No prenatal visits for the past five months, though has been attending MFM visits Swabs and 28wk labs collected today, doing 1hr GTT  2. Previous cesarean section Counseled on TOLAC vs RCS After discussion elects for RCS+IUD  insertion Will schedule for this coming Friday at 39 wks Message sent, orders placed  3. Obesity in pregnancy, antepartum BMI 46  Term labor symptoms and general obstetric precautions including but not limited to vaginal bleeding, contractions, leaking of fluid and fetal movement were reviewed in detail with the patient. Please refer to After Visit Summary for other counseling recommendations.  Return in 1 week (on 06/27/2021) for ob visit.   Venora Maples, MD

## 2021-06-20 NOTE — Patient Instructions (Signed)
Loralai Eisman  06/20/2021   Your procedure is scheduled on:  06/23/2021  Arrive at 1100 at Entrance C on [address] at [facility]  and [employer]. You are invited to use the FREE valet parking or use the Visitor's parking deck.  Pick up the phone at the desk and dial [phone].  Call this number if you have problems the morning of surgery: [phone]  Remember:   Do not eat food:(After Midnight) Desps de medianoche.  Do not drink clear liquids: (After Midnight) Desps de medianoche.  Take these medicines the morning of surgery with A SIP OF WATER:  none   Do not wear jewelry, make-up or nail polish.  Do not wear lotions, powders, or perfumes. Do not wear deodorant.  Do not shave 48 hours prior to surgery.  Do not bring valuables to the hospital.  [facility] is not   responsible for any belongings or valuables brought to the hospital.  Contacts, dentures or bridgework may not be worn into surgery.  Leave suitcase in the car. After surgery it may be brought to your room.  For patients admitted to the hospital, checkout time is 11:00 AM the day of              discharge.      Please read over the following fact sheets that you were given:     Preparing for Surgery

## 2021-06-20 NOTE — Telephone Encounter (Signed)
Call to patient to advise of surgery dyae.  No answer and no voice mail- unable to leave message. Letter will be sent My Chart.   Patient called back- advised of surgery date and time, arrival by 11am and to expect call from pre-op nurse for instructions and pre-op appointment.  Encounter closed.

## 2021-06-20 NOTE — Patient Instructions (Signed)

## 2021-06-21 ENCOUNTER — Other Ambulatory Visit: Payer: Self-pay

## 2021-06-21 ENCOUNTER — Encounter (HOSPITAL_COMMUNITY)
Admission: RE | Admit: 2021-06-21 | Discharge: 2021-06-21 | Disposition: A | Discharge status: Home / Self Care | Payer: Medicaid Other | Source: Ambulatory Visit | Attending: Family Medicine | Admitting: Family Medicine

## 2021-06-21 DIAGNOSIS — Z01812 Encounter for preprocedural laboratory examination: Secondary | ICD-10-CM | POA: Insufficient documentation

## 2021-06-21 LAB — COMPREHENSIVE METABOLIC PANEL
ALT: 18 U/L (ref 0–44)
AST: 23 U/L (ref 15–41)
Albumin: 2.6 g/dL — ABNORMAL LOW (ref 3.5–5.0)
Alkaline Phosphatase: 183 U/L — ABNORMAL HIGH (ref 38–126)
Anion gap: 6 (ref 5–15)
BUN: 8 mg/dL (ref 6–20)
CO2: 21 mmol/L — ABNORMAL LOW (ref 22–32)
Calcium: 8.8 mg/dL — ABNORMAL LOW (ref 8.9–10.3)
Chloride: 107 mmol/L (ref 98–111)
Creatinine, Ser: 0.62 mg/dL (ref 0.44–1.00)
GFR, Estimated: >60 mL/min (ref >60)
Glucose, Bld: 94 mg/dL (ref 70–99)
Potassium: 3.9 mmol/L (ref 3.5–5.1)
Sodium: 134 mmol/L — ABNORMAL LOW (ref 135–145)
Total Bilirubin: 0.6 mg/dL (ref 0.3–1.2)
Total Protein: 6 g/dL — ABNORMAL LOW (ref 6.5–8.1)

## 2021-06-21 LAB — CBC
HCT: 34.5 % — ABNORMAL LOW (ref 36.0–46.0)
Hematocrit: 35 % (ref 34.0–46.6)
Hemoglobin: 11.7 g/dL (ref 11.1–15.9)
Hemoglobin: 11.5 g/dL — ABNORMAL LOW (ref 12.0–15.0)
MCH: 28.5 pg (ref 26.6–33.0)
MCH: 29.1 pg (ref 26.0–34.0)
MCHC: 33.4 g/dL (ref 31.5–35.7)
MCHC: 33.3 g/dL (ref 30.0–36.0)
MCV: 85 fL (ref 79–97)
MCV: 87.3 fL (ref 80.0–100.0)
Platelets: 291 10*3/uL (ref 150–450)
Platelets: 282 10*3/uL (ref 150–400)
RBC: 4.1 x10E6/uL (ref 3.77–5.28)
RBC: 3.95 MIL/uL (ref 3.87–5.11)
RDW: 12.2 % (ref 11.7–15.4)
RDW: 12.4 % (ref 11.5–15.5)
WBC: 8.5 10*3/uL (ref 3.4–10.8)
WBC: 7.9 10*3/uL (ref 4.0–10.5)
nRBC: 0 % (ref 0.0–0.2)

## 2021-06-21 LAB — RPR
RPR Ser Ql: NONREACTIVE
RPR Ser Ql: NONREACTIVE

## 2021-06-21 LAB — GC/CHLAMYDIA PROBE AMP (~~LOC~~) NOT AT ARMC
Chlamydia: NEGATIVE
Comment: NEGATIVE
Comment: NORMAL
Neisseria Gonorrhea: NEGATIVE

## 2021-06-21 LAB — TYPE AND SCREEN
ABO/RH(D): O POS
Antibody Screen: NEGATIVE

## 2021-06-21 LAB — GLUCOSE, 1 HOUR GESTATIONAL: Gestational Diabetes Screen: 132 mg/dL (ref 65–139)

## 2021-06-21 LAB — HIV ANTIBODY (ROUTINE TESTING W REFLEX): HIV Screen 4th Generation wRfx: NONREACTIVE

## 2021-06-22 NOTE — H&P (Addendum)
OBSTETRIC ADMISSION HISTORY AND PHYSICAL  Charlotte [name] is a 25 y.o. female G2P1001 with IUP at [redacted]w[redacted]d by early [place] presenting for scheduled repeat CS. She reports +FMs, no LOF, no VB, no blurry vision, headaches, peripheral edema, or RUQ pain.  She plans on breast feeding. She request ppIUD for birth control.  She received her prenatal care at [facility].  Dating: By [place] --->  Estimated Date of Delivery: 06/30/21  Sono:   06/02/21 @[redacted]w[redacted]d , normal anatomy, cephalic presentation, anterior placental lie, 2878 g, 57% EFW  Prenatal History/Complications:  Obesity (BMI 45.11) Previous cesarean section Cystic fibrosis carrier MDD  Past Medical History: Past Medical History:  Diagnosis Date   Asthma    given an inhaler 6th grade   Obesity     Past Surgical History: Past Surgical History:  Procedure Laterality Date   CESAREAN SECTION N/A 05/02/2015   Procedure: CESAREAN SECTION;  Surgeon: 05/04/2015, MD;  Location: [facility];  Service: Obstetrics;  Laterality: N/A;    Obstetrical History: OB History     Gravida  2   Para  1   Term  1   Preterm  0   AB  0   Living  1      SAB  0   IAB  0   Ectopic  0   Multiple  0   Live Births  1           Social History Social History   Socioeconomic History   Marital status: Single    Spouse name: Not on file   Number of children: Not on file   Years of education: Not on file   Highest education level: Not on file  Occupational History   Not on file  Tobacco Use   Smoking status: Former    Packs/day: 0.25    Types: Cigars, Cigarettes   Smokeless tobacco: Never  Vaping Use   Vaping Use: Never used  Substance and Sexual Activity   Alcohol use: Not Currently   Drug use: Not Currently    Frequency: 7.0 times per week    Types: Marijuana    Comment: 11/03/2020   Sexual activity: Yes    Birth control/protection: None  Other Topics Concern   Not on file  Social History Narrative   Not on file   Social  Determinants of Health   Financial Resource Strain: Not on file  Food Insecurity: No Food Insecurity   Worried About 11/05/2020 in the Last Year: Never true   Ran Out of Food in the Last Year: Never true  Transportation Needs: No Transportation Needs   Lack of Transportation (Medical): No   Lack of Transportation (Non-Medical): No  Physical Activity: Not on file  Stress: Not on file  Social Connections: Not on file    Family History: Family History  Problem Relation Age of Onset   Cancer Paternal Grandmother    Diabetes Mother    Diabetes Maternal Grandmother     Allergies: No Known Allergies  No medications prior to admission.     Review of Systems  All systems reviewed and negative except as stated in HPI  Last menstrual period 08/01/2020.  General appearance: alert, cooperative, and no distress Lungs: clear to auscultation bilaterally Heart: regular rate and rhythm Abdomen: soft, non-tender; bowel sounds normal Extremities: no LE edema, SCDs in place  Prenatal labs: ABO, Rh: --/--/O POS (08/10 0957) Antibody: NEG (08/10 0957) Rubella: 1.01 (01/25 1222) RPR: NON REACTIVE (08/10 1000)  HBsAg: Negative (01/25 1222)  HIV: Non Reactive (08/09 1301)  GBS:  Collected 8/9 and pending 1 hr Glucola normal Genetic screening: Low risk NIPS; CF carrier Anatomy US normal  Prenatal Transfer Tool  Maternal Diabetes: No Genetic Screening: Carrier for CF; NIPS low risk Maternal Ultrasounds/Referrals: Normal Fetal Ultrasounds or other Referrals:  Referred to Materal Fetal Medicine  Maternal Substance Abuse:  No Significant Maternal Medications:  None Significant Maternal Lab Results: None  No results found for this or any previous visit (from the past 24 hour(s)).  Patient Active Problem List   Diagnosis Date Noted   Cystic fibrosis carrier 12/28/2020   Supervision of high risk pregnancy, antepartum 12/06/2020   BMI 40.0-44.9, adult (HCC) 12/06/2020   MDD  (major depressive disorder), recurrent severe, without psychosis (HCC) 10/04/2016   Previous cesarean section 05/01/2015   Obesity in pregnancy, antepartum    Obesity 10/18/2014   Hidradenitis suppurativa 10/18/2014    Assessment/Plan:  Charlotte [name] is a 25 y.o. G2P1001 at [redacted]w[redacted]d here for scheduled repeat cesarean section with post placental IUD insertion.   The risks of surgery were discussed with the patient including but were not limited to: bleeding which may require transfusion or reoperation; infection which may require antibiotics; injury to bowel, bladder, ureters or other surrounding organs; injury to the fetus; need for additional procedures including hysterectomy in the event of a life-threatening hemorrhage; formation of adhesions; placental abnormalities wth subsequent pregnancies; incisional problems; thromboembolic phenomenon and other postoperative/anesthesia complications.   For the postplacental IUD placement, discussed risks of postpartum expulsion, irregular bleeding, cramping, infection, malpositioning or misplacement of the IUD which may require further procedures. Also discussed >99% contraception efficacy, increased risk of ectopic pregnancy with failure of method.   The patient concurred with the proposed plan, giving informed written consent for the procedures.  #Pain: Spinal #ID: GBS unknown (culture collected 8/9); pre-op antibiotics ordered #MOF: Breast #MOC: ppIUD  Worthy Rancher, MD  Obstetrics Fellow  Faculty Practice 06/22/2021, 10:42 PM

## 2021-06-23 ENCOUNTER — Encounter (HOSPITAL_COMMUNITY): Payer: Self-pay | Admitting: Family Medicine

## 2021-06-23 ENCOUNTER — Inpatient Hospital Stay (HOSPITAL_COMMUNITY)
Admission: RE | Admit: 2021-06-23 | Discharge: 2021-06-25 | DRG: 787 | Disposition: A | Discharge status: Home / Self Care | Payer: Medicaid Other | Attending: Family Medicine | Admitting: Family Medicine

## 2021-06-23 ENCOUNTER — Encounter (HOSPITAL_COMMUNITY)
Admission: RE | Disposition: A | Discharge status: Home / Self Care | Payer: Self-pay | Source: Home / Self Care | Attending: Family Medicine

## 2021-06-23 ENCOUNTER — Other Ambulatory Visit: Payer: Self-pay

## 2021-06-23 ENCOUNTER — Inpatient Hospital Stay (HOSPITAL_COMMUNITY): Payer: Medicaid Other | Admitting: Anesthesiology

## 2021-06-23 ENCOUNTER — Other Ambulatory Visit: Payer: Self-pay | Admitting: Obstetrics & Gynecology

## 2021-06-23 DIAGNOSIS — Z141 Cystic fibrosis carrier: Secondary | ICD-10-CM | POA: Diagnosis not present

## 2021-06-23 DIAGNOSIS — O9081 Anemia of the puerperium: Secondary | ICD-10-CM | POA: Diagnosis not present

## 2021-06-23 DIAGNOSIS — Z20822 Contact with and (suspected) exposure to covid-19: Secondary | ICD-10-CM | POA: Diagnosis present

## 2021-06-23 DIAGNOSIS — D62 Acute posthemorrhagic anemia: Secondary | ICD-10-CM | POA: Diagnosis not present

## 2021-06-23 DIAGNOSIS — Z87891 Personal history of nicotine dependence: Secondary | ICD-10-CM | POA: Diagnosis not present

## 2021-06-23 DIAGNOSIS — Z30014 Encounter for initial prescription of intrauterine contraceptive device: Secondary | ICD-10-CM | POA: Diagnosis not present

## 2021-06-23 DIAGNOSIS — Z98891 History of uterine scar from previous surgery: Secondary | ICD-10-CM

## 2021-06-23 DIAGNOSIS — E669 Obesity, unspecified: Secondary | ICD-10-CM | POA: Diagnosis present

## 2021-06-23 DIAGNOSIS — O99214 Obesity complicating childbirth: Secondary | ICD-10-CM | POA: Diagnosis present

## 2021-06-23 DIAGNOSIS — Z30430 Encounter for insertion of intrauterine contraceptive device: Secondary | ICD-10-CM | POA: Diagnosis not present

## 2021-06-23 DIAGNOSIS — O34211 Maternal care for low transverse scar from previous cesarean delivery: Secondary | ICD-10-CM | POA: Diagnosis present

## 2021-06-23 DIAGNOSIS — Z3A39 39 weeks gestation of pregnancy: Secondary | ICD-10-CM

## 2021-06-23 LAB — SARS CORONAVIRUS 2 (TAT 6-24 HRS): SARS Coronavirus 2: NEGATIVE

## 2021-06-23 LAB — RESP PANEL BY RT-PCR (FLU A&B, COVID) ARPGX2
Influenza A by PCR: NEGATIVE
Influenza B by PCR: NEGATIVE
SARS Coronavirus 2 by RT PCR: NEGATIVE

## 2021-06-23 SURGERY — Surgical Case
Anesthesia: Spinal

## 2021-06-23 MED ORDER — OXYTOCIN-SODIUM CHLORIDE 30-0.9 UT/500ML-% IV SOLN
INTRAVENOUS | Status: AC
  Filled 2021-06-23: qty 500

## 2021-06-23 MED ORDER — DIPHENHYDRAMINE HCL 50 MG/ML IJ SOLN: 12.5000 mg | INTRAMUSCULAR | Status: DC | PRN

## 2021-06-23 MED ORDER — MENTHOL 3 MG MT LOZG: 1.0000 | LOZENGE | OROMUCOSAL | Status: DC | PRN

## 2021-06-23 MED ORDER — SENNOSIDES-DOCUSATE SODIUM 8.6-50 MG PO TABS
2.0000 | ORAL_TABLET | Freq: Every day | ORAL | Status: DC
  Administered 2021-06-24 – 2021-06-25 (×2): 2 via ORAL
  Filled 2021-06-23 (×2): qty 2

## 2021-06-23 MED ORDER — SCOPOLAMINE 1 MG/3DAYS TD PT72
1.0000 | MEDICATED_PATCH | Freq: Once | TRANSDERMAL | Status: DC
  Administered 2021-06-23: 1.5 mg via TRANSDERMAL

## 2021-06-23 MED ORDER — WITCH HAZEL-GLYCERIN EX PADS: 1.0000 "application " | MEDICATED_PAD | CUTANEOUS | Status: DC | PRN

## 2021-06-23 MED ORDER — DEXAMETHASONE SODIUM PHOSPHATE 10 MG/ML IJ SOLN
INTRAMUSCULAR | Status: AC
  Filled 2021-06-23: qty 1

## 2021-06-23 MED ORDER — MEDROXYPROGESTERONE ACETATE 150 MG/ML IM SUSP: 150.0000 mg | INTRAMUSCULAR | Status: DC | PRN

## 2021-06-23 MED ORDER — ENOXAPARIN SODIUM 80 MG/0.8ML IJ SOSY
70.0000 mg | PREFILLED_SYRINGE | INTRAMUSCULAR | Status: DC
  Administered 2021-06-24 – 2021-06-25 (×2): 70 mg via SUBCUTANEOUS
  Filled 2021-06-23 (×2): qty 0.8

## 2021-06-23 MED ORDER — TETANUS-DIPHTH-ACELL PERTUSSIS 5-2.5-18.5 LF-MCG/0.5 IM SUSY: 0.5000 mL | PREFILLED_SYRINGE | Freq: Once | INTRAMUSCULAR | Status: DC

## 2021-06-23 MED ORDER — MEPERIDINE HCL 25 MG/ML IJ SOLN: 6.2500 mg | INTRAMUSCULAR | Status: DC | PRN

## 2021-06-23 MED ORDER — IBUPROFEN 600 MG PO TABS
600.0000 mg | ORAL_TABLET | Freq: Four times a day (QID) | ORAL | Status: DC
  Administered 2021-06-24 – 2021-06-25 (×4): 600 mg via ORAL
  Filled 2021-06-23 (×4): qty 1

## 2021-06-23 MED ORDER — NALOXONE HCL 4 MG/10ML IJ SOLN
1.0000 ug/kg/h | INTRAVENOUS | Status: DC | PRN
  Filled 2021-06-23: qty 5

## 2021-06-23 MED ORDER — NALBUPHINE HCL 10 MG/ML IJ SOLN
5.0000 mg | Freq: Once | INTRAMUSCULAR | Status: DC | PRN
Start: 2021-06-23 — End: 2021-06-25

## 2021-06-23 MED ORDER — PHENYLEPHRINE HCL-NACL 20-0.9 MG/250ML-% IV SOLN
INTRAVENOUS | Status: DC | PRN
  Administered 2021-06-23: 60 ug/min via INTRAVENOUS

## 2021-06-23 MED ORDER — SODIUM CHLORIDE 0.9% FLUSH: 3.0000 mL | INTRAVENOUS | Status: DC | PRN

## 2021-06-23 MED ORDER — CEFAZOLIN IN SODIUM CHLORIDE 3-0.9 GM/100ML-% IV SOLN
3.0000 g | INTRAVENOUS | Status: AC
  Administered 2021-06-23: 3 g via INTRAVENOUS

## 2021-06-23 MED ORDER — SIMETHICONE 80 MG PO CHEW: 80.0000 mg | CHEWABLE_TABLET | ORAL | Status: DC | PRN

## 2021-06-23 MED ORDER — NALBUPHINE HCL 10 MG/ML IJ SOLN
5.0000 mg | INTRAMUSCULAR | Status: DC | PRN
Start: 2021-06-23 — End: 2021-06-25

## 2021-06-23 MED ORDER — ONDANSETRON HCL 4 MG/2ML IJ SOLN
INTRAMUSCULAR | Status: AC
  Filled 2021-06-23: qty 2

## 2021-06-23 MED ORDER — KETOROLAC TROMETHAMINE 30 MG/ML IJ SOLN
30.0000 mg | Freq: Four times a day (QID) | INTRAMUSCULAR | Status: AC | PRN
  Administered 2021-06-23 – 2021-06-24 (×2): 30 mg via INTRAVENOUS

## 2021-06-23 MED ORDER — OXYCODONE HCL 5 MG PO TABS: 5.0000 mg | ORAL_TABLET | ORAL | Status: DC | PRN

## 2021-06-23 MED ORDER — MORPHINE SULFATE (PF) 0.5 MG/ML IJ SOLN
INTRAMUSCULAR | Status: AC
  Filled 2021-06-23: qty 10

## 2021-06-23 MED ORDER — OXYTOCIN-SODIUM CHLORIDE 30-0.9 UT/500ML-% IV SOLN
INTRAVENOUS | Status: DC | PRN
  Administered 2021-06-23 (×2): 30 [IU] via INTRAVENOUS

## 2021-06-23 MED ORDER — FENTANYL CITRATE (PF) 100 MCG/2ML IJ SOLN
INTRAMUSCULAR | Status: DC | PRN
  Administered 2021-06-23: 15 ug via INTRAVENOUS

## 2021-06-23 MED ORDER — FENTANYL CITRATE (PF) 100 MCG/2ML IJ SOLN: 25.0000 ug | INTRAMUSCULAR | Status: DC | PRN

## 2021-06-23 MED ORDER — MORPHINE SULFATE (PF) 0.5 MG/ML IJ SOLN
INTRAMUSCULAR | Status: DC | PRN
  Administered 2021-06-23: .15 mg via EPIDURAL

## 2021-06-23 MED ORDER — NALOXONE HCL 0.4 MG/ML IJ SOLN: 0.4000 mg | INTRAMUSCULAR | Status: DC | PRN

## 2021-06-23 MED ORDER — KETOROLAC TROMETHAMINE 30 MG/ML IJ SOLN
INTRAMUSCULAR | Status: AC
  Filled 2021-06-23: qty 1

## 2021-06-23 MED ORDER — BUPIVACAINE HCL 0.25 % IJ SOLN
INTRAMUSCULAR | Status: DC | PRN
  Administered 2021-06-23: 30 mL

## 2021-06-23 MED ORDER — DIPHENHYDRAMINE HCL 25 MG PO CAPS: 25.0000 mg | ORAL_CAPSULE | Freq: Four times a day (QID) | ORAL | Status: DC | PRN

## 2021-06-23 MED ORDER — KETOROLAC TROMETHAMINE 30 MG/ML IJ SOLN
30.0000 mg | Freq: Four times a day (QID) | INTRAMUSCULAR | Status: AC
Start: 2021-06-23 — End: 2021-06-24
  Administered 2021-06-23 – 2021-06-24 (×2): 30 mg via INTRAVENOUS
  Filled 2021-06-23 (×3): qty 1

## 2021-06-23 MED ORDER — OXYTOCIN-SODIUM CHLORIDE 30-0.9 UT/500ML-% IV SOLN: 2.5000 [IU]/h | INTRAVENOUS | Status: AC

## 2021-06-23 MED ORDER — ONDANSETRON HCL 4 MG/2ML IJ SOLN: 4.0000 mg | Freq: Three times a day (TID) | INTRAMUSCULAR | Status: DC | PRN

## 2021-06-23 MED ORDER — DIBUCAINE (PERIANAL) 1 % EX OINT: 1.0000 "application " | TOPICAL_OINTMENT | CUTANEOUS | Status: DC | PRN

## 2021-06-23 MED ORDER — BUPIVACAINE HCL (PF) 0.25 % IJ SOLN
INTRAMUSCULAR | Status: AC
  Filled 2021-06-23: qty 30

## 2021-06-23 MED ORDER — FENTANYL CITRATE (PF) 100 MCG/2ML IJ SOLN
INTRAMUSCULAR | Status: AC
  Filled 2021-06-23: qty 2

## 2021-06-23 MED ORDER — CEFAZOLIN IN SODIUM CHLORIDE 3-0.9 GM/100ML-% IV SOLN
INTRAVENOUS | Status: AC
  Filled 2021-06-23: qty 100

## 2021-06-23 MED ORDER — ACETAMINOPHEN 500 MG PO TABS
1000.0000 mg | ORAL_TABLET | Freq: Four times a day (QID) | ORAL | Status: AC
Start: 2021-06-23 — End: 2021-06-24
  Administered 2021-06-23 – 2021-06-24 (×4): 1000 mg via ORAL
  Filled 2021-06-23 (×4): qty 2

## 2021-06-23 MED ORDER — COCONUT OIL OIL: 1.0000 "application " | TOPICAL_OIL | Status: DC | PRN

## 2021-06-23 MED ORDER — PHENYLEPHRINE 40 MCG/ML (10ML) SYRINGE FOR IV PUSH (FOR BLOOD PRESSURE SUPPORT)
PREFILLED_SYRINGE | INTRAVENOUS | Status: DC | PRN
  Administered 2021-06-23 (×2): 40 ug via INTRAVENOUS

## 2021-06-23 MED ORDER — KETOROLAC TROMETHAMINE 30 MG/ML IJ SOLN: 30.0000 mg | Freq: Four times a day (QID) | INTRAMUSCULAR | Status: AC | PRN

## 2021-06-23 MED ORDER — POVIDONE-IODINE 10 % EX SWAB
2.0000 "application " | Freq: Once | CUTANEOUS | Status: AC
  Administered 2021-06-23: 2 via TOPICAL

## 2021-06-23 MED ORDER — PHENYLEPHRINE 40 MCG/ML (10ML) SYRINGE FOR IV PUSH (FOR BLOOD PRESSURE SUPPORT)
PREFILLED_SYRINGE | INTRAVENOUS | Status: AC
  Filled 2021-06-23: qty 20

## 2021-06-23 MED ORDER — MEASLES, MUMPS & RUBELLA VAC IJ SOLR: 0.5000 mL | Freq: Once | INTRAMUSCULAR | Status: DC

## 2021-06-23 MED ORDER — SIMETHICONE 80 MG PO CHEW
80.0000 mg | CHEWABLE_TABLET | Freq: Three times a day (TID) | ORAL | Status: DC
  Administered 2021-06-24 – 2021-06-25 (×5): 80 mg via ORAL
  Filled 2021-06-23 (×4): qty 1

## 2021-06-23 MED ORDER — BUPIVACAINE IN DEXTROSE 0.75-8.25 % IT SOLN
INTRATHECAL | Status: DC | PRN
  Administered 2021-06-23: 1.6 mg via INTRATHECAL

## 2021-06-23 MED ORDER — PRENATAL MULTIVITAMIN CH
1.0000 | ORAL_TABLET | Freq: Every day | ORAL | Status: DC
  Administered 2021-06-24 – 2021-06-25 (×2): 1 via ORAL
  Filled 2021-06-23 (×2): qty 1

## 2021-06-23 MED ORDER — SCOPOLAMINE 1 MG/3DAYS TD PT72
MEDICATED_PATCH | TRANSDERMAL | Status: AC
  Filled 2021-06-23: qty 1

## 2021-06-23 MED ORDER — PHENYLEPHRINE HCL-NACL 20-0.9 MG/250ML-% IV SOLN
INTRAVENOUS | Status: AC
  Filled 2021-06-23: qty 250

## 2021-06-23 MED ORDER — LEVONORGESTREL 20.1 MCG/DAY IU IUD
1.0000 | INTRAUTERINE_SYSTEM | Freq: Once | INTRAUTERINE | Status: AC
  Administered 2021-06-23: 1 via INTRAUTERINE

## 2021-06-23 MED ORDER — SOD CITRATE-CITRIC ACID 500-334 MG/5ML PO SOLN
30.0000 mL | ORAL | Status: AC
  Administered 2021-06-23: 30 mL via ORAL

## 2021-06-23 MED ORDER — SOD CITRATE-CITRIC ACID 500-334 MG/5ML PO SOLN
ORAL | Status: AC
  Filled 2021-06-23: qty 30

## 2021-06-23 MED ORDER — ONDANSETRON HCL 4 MG/2ML IJ SOLN
INTRAMUSCULAR | Status: DC | PRN
  Administered 2021-06-23: 4 mg via INTRAVENOUS

## 2021-06-23 MED ORDER — LACTATED RINGERS IV SOLN: INTRAVENOUS | Status: DC

## 2021-06-23 MED ORDER — NALBUPHINE HCL 10 MG/ML IJ SOLN: 5.0000 mg | INTRAMUSCULAR | Status: DC | PRN

## 2021-06-23 MED ORDER — LEVONORGESTREL 20.1 MCG/DAY IU IUD
INTRAUTERINE_SYSTEM | INTRAUTERINE | Status: AC
  Filled 2021-06-23: qty 1

## 2021-06-23 MED ORDER — DIPHENHYDRAMINE HCL 25 MG PO CAPS: 25.0000 mg | ORAL_CAPSULE | ORAL | Status: DC | PRN

## 2021-06-23 MED ORDER — NALBUPHINE HCL 10 MG/ML IJ SOLN: 5.0000 mg | Freq: Once | INTRAMUSCULAR | Status: DC | PRN

## 2021-06-23 SURGICAL SUPPLY — 34 items
BENZOIN TINCTURE PRP APPL 2/3 (GAUZE/BANDAGES/DRESSINGS) ×2 IMPLANT
CLAMP CORD UMBIL (MISCELLANEOUS) ×2 IMPLANT
CLOTH BEACON ORANGE TIMEOUT ST (SAFETY) ×2 IMPLANT
DRESSING PREVENA PLUS CUSTOM (GAUZE/BANDAGES/DRESSINGS) ×1 IMPLANT
DRSG OPSITE POSTOP 4X10 (GAUZE/BANDAGES/DRESSINGS) ×2 IMPLANT
DRSG PREVENA PLUS CUSTOM (GAUZE/BANDAGES/DRESSINGS) ×2
ELECT REM PT RETURN 9FT ADLT (ELECTROSURGICAL) ×2
ELECTRODE REM PT RTRN 9FT ADLT (ELECTROSURGICAL) ×1 IMPLANT
EXTRACTOR VACUUM M CUP 4 TUBE (SUCTIONS) IMPLANT
GLOVE BIOGEL PI IND STRL 7.0 (GLOVE) ×3 IMPLANT
GLOVE BIOGEL PI INDICATOR 7.0 (GLOVE) ×3
GLOVE ECLIPSE 7.0 STRL STRAW (GLOVE) ×2 IMPLANT
GOWN STRL REUS W/TWL LRG LVL3 (GOWN DISPOSABLE) ×4 IMPLANT
KIT ABG SYR 3ML LUER SLIP (SYRINGE) ×2 IMPLANT
NEEDLE HYPO 22GX1.5 SAFETY (NEEDLE) ×2 IMPLANT
NEEDLE HYPO 25X5/8 SAFETYGLIDE (NEEDLE) ×2 IMPLANT
NS IRRIG 1000ML POUR BTL (IV SOLUTION) ×2 IMPLANT
PACK C SECTION WH (CUSTOM PROCEDURE TRAY) ×2 IMPLANT
PAD ABD 7.5X8 STRL (GAUZE/BANDAGES/DRESSINGS) ×2 IMPLANT
PAD OB MATERNITY 4.3X12.25 (PERSONAL CARE ITEMS) ×2 IMPLANT
PENCIL SMOKE EVAC W/HOLSTER (ELECTROSURGICAL) ×2 IMPLANT
RTRCTR C-SECT PINK 25CM LRG (MISCELLANEOUS) ×2 IMPLANT
STRIP CLOSURE SKIN 1/2X4 (GAUZE/BANDAGES/DRESSINGS) ×2 IMPLANT
STRIP SURGICAL 1/4 X 6 IN (GAUZE/BANDAGES/DRESSINGS) ×2 IMPLANT
SUT CHROMIC 0 CT 1 (SUTURE) ×2 IMPLANT
SUT MNCRL 0 VIOLET CTX 36 (SUTURE) ×2 IMPLANT
SUT MONOCRYL 0 CTX 36 (SUTURE) ×2
SUT VIC AB 0 CTX 36 (SUTURE) ×1
SUT VIC AB 0 CTX36XBRD ANBCTRL (SUTURE) ×1 IMPLANT
SUT VIC AB 4-0 KS 27 (SUTURE) ×2 IMPLANT
SYR 30ML LL (SYRINGE) ×2 IMPLANT
TOWEL OR 17X24 6PK STRL BLUE (TOWEL DISPOSABLE) ×2 IMPLANT
TRAY FOLEY W/BAG SLVR 14FR LF (SET/KITS/TRAYS/PACK) ×2 IMPLANT
WATER STERILE IRR 1000ML POUR (IV SOLUTION) ×2 IMPLANT

## 2021-06-23 NOTE — Discharge Summary (Addendum)
Postpartum Discharge Summary  Date of Service updated: 06/25/21   Patient Name: Charlotte [name] DOB: [birth year] MRN: [id number]  Date of admission: 06/23/2021 Delivery date:06/23/2021  Delivering provider: Donnamae Jude  Date of discharge: 06/25/2021  Admitting diagnosis: History of cesarean section [Z98.891] Cesarean delivery delivered [O82] Intrauterine pregnancy: [redacted]w[redacted]d     Secondary diagnosis:  Principal Problem:   Cesarean delivery delivered Active Problems:   Obesity   History of cesarean section  Additional problems: None    Discharge diagnosis: Term Pregnancy Delivered                                              Post partum procedures: IV venofer Augmentation: N/A Complications: None  Hospital course: Sceduled C/S   25 y.o. yo G2P2002 at [redacted]w[redacted]d was admitted to the hospital 06/23/2021 for scheduled cesarean section with the following indication:Elective Repeat.Delivery details are as follows:  Membrane Rupture Time/Date: 2:22 PM ,06/23/2021   Delivery Method:C-Section, Low Transverse  Details of operation can be found in separate operative note.  Patient had a PP course complicated by anemia. Received IV Venofer. Ambulating w/out dizziness.  She is ambulating, tolerating a regular diet, passing flatus, and urinating well. Patient is discharged home in stable condition on  06/25/21        Newborn Data: Birth date:06/23/2021  Birth time:2:22 PM  Gender:Female  Living status:Living  Apgars:9 ,9  Weight:8 lb 12.6 oz (3.985 kg)     Magnesium Sulfate received: No BMZ received: No Rhophylac:N/A MMR:N/A - Immune T-DaP: No - needs PP Flu: Yes - given prenatally Transfusion: IV venofer   Physical exam  Vitals:   06/24/21 0830 06/24/21 1442 06/25/21 0201 06/25/21 0611  BP: (!) 111/55 121/64 130/62 118/63  Pulse: 63 64 81 85  Resp: $Remo'16 16 18 16  'fWcjo$ Temp:  97.6 F (36.4 C) 98.1 F (36.7 C) 97.6 F (36.4 C)  TempSrc:  Axillary Oral Oral  SpO2: 99% 100% 98% 98%   Weight:      Height:       General: alert, cooperative, and no distress Lochia: appropriate Uterine Fundus: firm Incision: Prevena in place DVT Evaluation: No evidence of DVT seen on physical exam.  Labs: Lab Results  Component Value Date   WBC 13.0 (H) 06/24/2021   HGB 8.7 (L) 06/24/2021   HCT 26.2 (L) 06/24/2021   MCV 88.2 06/24/2021   PLT 248 06/24/2021   CMP Latest Ref Rng & Units 06/21/2021  Glucose 70 - 99 mg/dL 94  BUN 6 - 20 mg/dL 8  Creatinine 0.44 - 1.00 mg/dL 0.62  Sodium 135 - 145 mmol/L 134(L)  Potassium 3.5 - 5.1 mmol/L 3.9  Chloride 98 - 111 mmol/L 107  CO2 22 - 32 mmol/L 21(L)  Calcium 8.9 - 10.3 mg/dL 8.8(L)  Total Protein 6.5 - 8.1 g/dL 6.0(L)  Total Bilirubin 0.3 - 1.2 mg/dL 0.6  Alkaline Phos 38 - 126 U/L 183(H)  AST 15 - 41 U/L 23  ALT 0 - 44 U/L 18   Edinburgh Score: Edinburgh Postnatal Depression Scale Screening Tool 06/25/2021  I have been able to laugh and see the funny side of things. 0  I have looked forward with enjoyment to things. 0  I have blamed myself unnecessarily when things went wrong. 0  I have been anxious or worried for no good reason. 0  I  have felt scared or panicky for no good reason. 0  Things have been getting on top of me. 0  I have been so unhappy that I have had difficulty sleeping. 0  I have felt sad or miserable. 0  I have been so unhappy that I have been crying. 0  The thought of harming myself has occurred to me. 0  Edinburgh Postnatal Depression Scale Total 0     After visit meds:  Allergies as of 06/25/2021   No Known Allergies      Medication List     TAKE these medications    acetaminophen 325 MG tablet Commonly known as: Tylenol Take 2 tablets (650 mg total) by mouth every 6 (six) hours as needed.   coconut oil Oil Apply 1 application topically as needed.   ferrous sulfate 325 (65 FE) MG tablet Take 1 tablet (325 mg total) by mouth every other day.   ibuprofen 600 MG tablet Commonly known as:  ADVIL Take 1 tablet (600 mg total) by mouth every 6 (six) hours.   multivitamin-prenatal 27-0.8 MG Tabs tablet Take 1 tablet by mouth daily at 12 noon.   oxyCODONE 5 MG immediate release tablet Commonly known as: Oxy IR/ROXICODONE Take 1 tablet (5 mg total) by mouth every 4 (four) hours as needed.   polyethylene glycol powder 17 GM/SCOOP powder Commonly known as: GLYCOLAX/MIRALAX Take 17 g by mouth daily as needed for moderate constipation.         Discharge home in stable condition Infant Feeding: Breast Infant Disposition:home with mother Discharge instruction: per After Visit Summary and Postpartum booklet. Activity: Advance as tolerated. Pelvic rest for 6 weeks.  Diet: routine diet and iron rich diet Future Appointments: Future Appointments  Date Time Provider [place]  07/04/2021  2:15 PM Helaine Chess [facility]   Follow up Visit: Message sent by Dr. Gwenlyn Perking on 06/23/21 at 3:57 PM.  Please schedule this patient for a In person postpartum visit in 6 weeks with the following provider: Any provider. Additional Postpartum F/U:Incision check 1 week  High risk pregnancy complicated by:  Hx of CS, Obesity Delivery mode:  C-Section, Low Transverse  Anticipated Birth Control:  PP IUD placed   06/25/2021 Erskine Emery, MD  I was present for the exam and agree with above.  Tamala Julian, [place], [place] 06/25/2021 2:14 PM

## 2021-06-23 NOTE — Anesthesia Preprocedure Evaluation (Signed)
Anesthesia Evaluation  Patient identified by MRN, date of birth, ID band Patient awake    Reviewed: Allergy & Precautions, NPO status , Patient's Chart, lab work & pertinent test results  Airway Mallampati: II  TM Distance: >3 FB     Dental  (+) Dental Advisory Given   Pulmonary asthma , former smoker,    breath sounds clear to auscultation       Cardiovascular negative cardio ROS   Rhythm:Regular Rate:Normal     Neuro/Psych negative neurological ROS     GI/Hepatic negative GI ROS, Neg liver ROS,   Endo/Other  Morbid obesity  Renal/GU negative Renal ROS     Musculoskeletal   Abdominal   Peds  Hematology negative hematology ROS (+)   Anesthesia Other Findings   Reproductive/Obstetrics (+) Pregnancy                             Lab Results  Component Value Date   WBC 7.9 06/21/2021   HGB 11.5 (L) 06/21/2021   HCT 34.5 (L) 06/21/2021   MCV 87.3 06/21/2021   PLT 282 06/21/2021   Lab Results  Component Value Date   CREATININE 0.62 06/21/2021   BUN 8 06/21/2021   NA 134 (L) 06/21/2021   K 3.9 06/21/2021   CL 107 06/21/2021   CO2 21 (L) 06/21/2021    Anesthesia Physical Anesthesia Plan  ASA: 3  Anesthesia Plan: Spinal   Post-op Pain Management:    Induction:   PONV Risk Score and Plan: 2 and Dexamethasone, Ondansetron and Treatment may vary due to age or medical condition  Airway Management Planned: Natural Airway  Additional Equipment:   Intra-op Plan:   Post-operative Plan:   Informed Consent: I have reviewed the patients History and Physical, chart, labs and discussed the procedure including the risks, benefits and alternatives for the proposed anesthesia with the patient or authorized representative who has indicated his/her understanding and acceptance.       Plan Discussed with: CRNA  Anesthesia Plan Comments:         Anesthesia Quick  Evaluation

## 2021-06-23 NOTE — Anesthesia Postprocedure Evaluation (Signed)
Anesthesia Post Note  Patient: Charlotte [name]  Procedure(s) Performed: CESAREAN Lady Deutscher IUD insert     Patient location during evaluation: PACU Anesthesia Type: Spinal Level of consciousness: awake and alert Pain management: pain level controlled Vital Signs Assessment: post-procedure vital signs reviewed and stable Respiratory status: spontaneous breathing and respiratory function stable Cardiovascular status: blood pressure returned to baseline and stable Postop Assessment: spinal receding Anesthetic complications: no   No notable events documented.  Last Vitals:  Vitals:   06/23/21 1600 06/23/21 1615  BP: 127/71 105/70  Pulse: 62 60  Resp: 20 14  Temp:    SpO2: 100% 100%    Last Pain:  Vitals:   06/23/21 1615  TempSrc:   PainSc: 0-No pain   Pain Goal:    LLE Motor Response: Purposeful movement (06/23/21 1615)   RLE Motor Response: Purposeful movement (06/23/21 1615)       Epidural/Spinal Function Cutaneous sensation: Tingles (06/23/21 1600), Patient able to flex knees: No (06/23/21 1600), Patient able to lift hips off bed: No (06/23/21 1600), Back pain beyond tenderness at insertion site: No (06/23/21 1600), Progressively worsening motor and/or sensory loss: No (06/23/21 1600), Bowel and/or bladder incontinence post epidural: No (06/23/21 1600)  Kennieth Rad

## 2021-06-23 NOTE — Transfer of Care (Signed)
Immediate Anesthesia Transfer of Care Note  Patient: Charlotte [name]  Procedure(s) Performed: CESAREAN Lady Deutscher IUD insert  Patient Location: PACU  Anesthesia Type:Spinal  Level of Consciousness: awake, alert  and oriented  Airway & Oxygen Therapy: Patient Spontanous Breathing  Post-op Assessment: Report given to RN and Post -op Vital signs reviewed and stable  Post vital signs: Reviewed and stable  Last Vitals:  Vitals Value Taken Time  BP 122/74 06/23/21 1554  Temp    Pulse 69 06/23/21 1558  Resp 23 06/23/21 1558  SpO2 100 % 06/23/21 1558  Vitals shown include unvalidated device data.  Last Pain:  Vitals:   06/23/21 1156  TempSrc: Oral         Complications: No notable events documented.

## 2021-06-23 NOTE — Anesthesia Procedure Notes (Signed)
Spinal  Patient location during procedure: OR Start time: 06/23/2021 1:47 PM End time: 06/23/2021 1:52 PM Reason for block: surgical anesthesia Staffing Performed: anesthesiologist  Anesthesiologist: Marcene Duos, MD Preanesthetic Checklist Completed: patient identified, IV checked, site marked, risks and benefits discussed, surgical consent, monitors and equipment checked, pre-op evaluation and timeout performed Spinal Block Patient position: sitting Prep: DuraPrep Patient monitoring: heart rate, cardiac monitor, continuous pulse ox and blood pressure Approach: midline Location: L4-5 Injection technique: single-shot Needle Needle type: Pencan  Needle gauge: 24 G Needle length: 9 cm Assessment Sensory level: T4 Events: CSF return

## 2021-06-23 NOTE — Op Note (Signed)
Preoperative Diagnosis: IUP @ [redacted]w[redacted]d, history of cesarean section, desire for contraception  Postoperative Diagnosis: Same  Procedure: Repeat low transverse cesarean section, IUD placement  Surgeon: Tinnie Gens, M.D.  Assistant: Evalina Field, M.D.  Findings:  Viable female infant APGAR (1 MIN): 9 APGAR (5 MINS): 9 Weight pending, cephalic presentation, ROA position Normal appearing bilateral fallopian tubes and ovaries.  IVF: 1000 mL of LR Estimated blood loss: 1331 cc Urine output: 200 cc  Complications: None known  Specimens: Placenta to labor and delivery  Reason for procedure: Briefly, the patient is a 25 y.o. G2P1001 at [redacted]w[redacted]d who presents for repeat cesarean section and post placental IUD insertion.  Procedure: Patient was taken to the OR where spinal analgesia was administered. She was then placed in a supine position with left lateral tilt. She received 3 g of Ancef and SCDs were in place. A timeout was performed. She was prepped and draped in the usual sterile fashion. A Foley catheter was placed in the bladder. A knife was then used to make a Pfannenstiel incision. This incision was carried out to underlying fascia which was divided in the midline with the knife. The incision was extended laterally bluntly and sharply.  The rectus was divided in the midline.  The peritoneal cavity was entered sharply with Metzenbaum scissors and separated bluntly.  Alexis retractor was placed inside the incision.  A knife was used to make a low transverse incision on the uterus. This incision was carried down to the amniotic cavity was entered. Fetus was in cephalic position and was brought up out of the incision without difficulty. Cord was clamped x 2 and cut. Infant taken to waiting nurse.  Cord blood was obtained. Placenta was delivered from the uterus.  Uterus was cleaned with dry lap pads. Liletta IUD removed from packaging, strings trimmed. IUD placed at fundus and strings directed to  the cervix. Uterine incision closed with 0 Monocryl suture in a locked running fashion. A second layer of 0 Monocryl in an imbricating fashion was used to achieve hemostasis. Alexis retractor was removed from the abdomen. Peritoneal closure was done with 0 Monocryl suture.  Fascia is closed with 0 Vicryl suture in a running fashion. Subcutaneous tissue infused with 30cc 0.25% Marcaine.  Subcutaneous closure was performed with 0 plain suture.  Skin closed using 3-0 Vicryl on a Keith needle.  Steri strips applied, followed by Prevena dressing.  All instrument, needle and lap counts were correct x 2.  Patient was awake and taken to PACU stable.  Infant remained with mom in couplet care, stable.   Worthy Rancher, MD Obstetrics Fellow  Faculty Practice 06/23/2021 4:59 PM

## 2021-06-24 LAB — CBC
HCT: 26.2 % — ABNORMAL LOW (ref 36.0–46.0)
Hemoglobin: 8.7 g/dL — ABNORMAL LOW (ref 12.0–15.0)
MCH: 29.3 pg (ref 26.0–34.0)
MCHC: 33.2 g/dL (ref 30.0–36.0)
MCV: 88.2 fL (ref 80.0–100.0)
Platelets: 248 10*3/uL (ref 150–400)
RBC: 2.97 MIL/uL — ABNORMAL LOW (ref 3.87–5.11)
RDW: 12.5 % (ref 11.5–15.5)
WBC: 13 10*3/uL — ABNORMAL HIGH (ref 4.0–10.5)
nRBC: 0 % (ref 0.0–0.2)

## 2021-06-24 LAB — CULTURE, BETA STREP (GROUP B ONLY): Strep Gp B Culture: NEGATIVE

## 2021-06-24 MED ORDER — LACTATED RINGERS IV BOLUS
500.0000 mL | Freq: Once | INTRAVENOUS | Status: AC
  Administered 2021-06-24: 500 mL via INTRAVENOUS

## 2021-06-24 MED ORDER — SODIUM CHLORIDE 0.9 % IV SOLN
500.0000 mg | Freq: Once | INTRAVENOUS | Status: AC
  Administered 2021-06-24: 500 mg via INTRAVENOUS
  Filled 2021-06-24: qty 25

## 2021-06-24 NOTE — Lactation Note (Signed)
This note was copied from a baby's chart. Lactation Consultation Note Mom awake when LC came into room. Mom holding sleeping baby. Mom stated baby is latching well.  Mom attempted BF to her now 25 yr old but mom stated after 1 week she had to do complete formula. Mom stated she never had but very little milk to come in.  Mom has very wide spaced breast w/everted nipples. Hand expression attempted but no colostrum noted at this time. Discussed w/mom pumping. Mom in agreement. Mom shown how to use DEBP & how to disassemble, clean, & reassemble parts.  Mom knows to pump q3h for 15-20 min. Mom encouraged to feed baby 8-12 times/24 hours and with feeding cues.    Newborn feeding habits, STS, the importance of I&O, supply and demand.  Mom asked questions about giving formula. Mom is breast/formula.   Encouraged mom to call for assistance or questions.  Patient Name: Charlotte [name] [id number] Date: 06/24/2021 Reason for consult: Initial assessment;Term Age:79 hours  Maternal Data Has patient been taught Hand Expression?: Yes Does the patient have breastfeeding experience prior to this delivery?: Yes How long did the patient breastfeed?: 1 week  Feeding Nipple Type: Extra Slow Flow  LATCH Score       Type of Nipple: Everted at rest and after stimulation  Comfort (Breast/Nipple): Soft / non-tender         Lactation Tools Discussed/Used Tools: Pump Breast pump type: Double-Electric Breast Pump Pump Education: Setup, frequency, and cleaning;Milk Storage Reason for Pumping: Hx; Low milk supply Pumping frequency: q 3 hr  Interventions Interventions: Breast feeding basics reviewed;Skin to skin;Hand express;Breast massage;DEBP;Breast compression  Discharge WIC Program: Yes  Consult Status Consult Status: Follow-up Date: 06/24/21 Follow-up type: In-patient    Charyl Dancer 06/24/2021, 12:57 AM

## 2021-06-24 NOTE — Progress Notes (Addendum)
POSTPARTUM PROGRESS NOTE  Subjective: Charlotte [name] is a 25 y.o. [id number] s/p POD#1 LTCS at [redacted]w[redacted]d.  She reports she doing well. No acute events overnight. She denies any problems with ambulating, voiding or po intake. Denies nausea or vomiting. She has passed flatus. Pain is well controlled.  Lochia is appropriate.  Objective: Blood pressure (!) 111/55, pulse 63, temperature 98.5 F (36.9 C), temperature source Oral, resp. rate 16, height 5\' 7"  (1.702 m), weight 130.6 kg, last menstrual period 08/01/2020, SpO2 99 %, unknown if currently breastfeeding.  Physical Exam:  General: alert, cooperative and no distress Chest: no respiratory distress Abdomen: soft, non-tender  Uterine Fundus: firm and at level of umbilicus Extremities: No calf swelling or tenderness  No edema Prevena in place  Recent Labs    06/24/21 0544  HGB 8.7*  HCT 26.2*    Assessment/Plan: Charlotte [name] is a 25 y.o. 25 s/p POD#1 LTCS at [redacted]w[redacted]d.  Routine Postpartum Care: Doing well, pain well-controlled.  -- Continue routine care, lactation support  -- Contraception: POPs -- Feeding: breast -- Hgb 11.5>8.7, IV venofer. Pt is not symptomatic at this time  Dispo: Plan for discharge POD#2-3.  [redacted]w[redacted]d, MD Faculty Practice, Center for [facility] 06/24/2021 10:51 AM

## 2021-06-24 NOTE — Progress Notes (Signed)
CSW met with MOB to complete consult for major depressive disorder. CSW observed MOB resting in bed, and sister in recliner, bonding with infant. MOB gave CSW verbal consent to complete consult while sister was present. CSW explained role, and reason for consult. MOB was pleasant, polite, and engaged with CSW. MOB reported, she believe she was misdiagnose with major depressive disorder. MOB reported, during that time she just had a child, and was experiencing anxiety, and postpartum depression. MOB reported, history of  inpatient treatment, and denied, any psychotropic medication since admission (2017).   CSW provided education regarding the baby blues period vs. perinatal mood disorders, discussed treatment and gave resources for mental health follow up if concerns arise. CSW recommends self- evaluation during the postpartum time period using the New Mom Checklist from Postpartum Progress and encouraged MOB to contact a medical professional if symptoms are noted at any time.   When CSW asked MOB of her emotions since delivery. MOB reported, she feels, "good". MOB reported, [name], parents, and siblings are her supports. MOB denied SI, HI, and DV when CSW assessed for safety.   MOB reported, there are no barriers to follow up infant's care. MOB reported, she has all essentials needed to care for infant. MOB reported, infant has a car seat, and bassinet. MOB denied any additional barriers.     CSW provided education on sudden infant death syndrome (SIDS).  CSW identifies no further need for intervention or barriers to discharge at this time.   Charlotte [name], MSW, LCSW-A Clinical Social Worker- Weekends [phone]  

## 2021-06-25 MED ORDER — COCONUT OIL OIL: 1.0000 "application " | TOPICAL_OIL | 0 refills | Status: DC | PRN

## 2021-06-25 MED ORDER — FERROUS SULFATE 325 (65 FE) MG PO TABS: 325.0000 mg | ORAL_TABLET | ORAL | 0 refills | Status: DC

## 2021-06-25 MED ORDER — OXYCODONE HCL 5 MG PO TABS: 5.0000 mg | ORAL_TABLET | ORAL | 0 refills | Status: DC | PRN

## 2021-06-25 MED ORDER — POLYETHYLENE GLYCOL 3350 17 GM/SCOOP PO POWD: 17.0000 g | Freq: Every day | ORAL | 2 refills | Status: DC | PRN

## 2021-06-25 MED ORDER — ACETAMINOPHEN 325 MG PO TABS: 650.0000 mg | ORAL_TABLET | Freq: Four times a day (QID) | ORAL | Status: DC | PRN

## 2021-06-25 MED ORDER — IBUPROFEN 600 MG PO TABS: 600.0000 mg | ORAL_TABLET | Freq: Four times a day (QID) | ORAL | 0 refills | Status: DC

## 2021-06-25 NOTE — Lactation Note (Signed)
This note was copied from a baby's chart. Lactation Consultation Note  Patient Name: Charlotte [name] Date: 06/25/2021 Reason for consult: Follow-up assessment;Term Age:24 hours  LC in to visit with P2 Mom of term infant on day of discharge.  Mom hasn't been pumping or breastfeeding.  Mom feels good about her decision to formula feed and doesn't have any questions or concerns for lactation consultant.   LC explained that if she had chosen to pump and support her milk supply, a [facility] loaner pump could have been provided.  Mom states she knows how to use the hand pump if needed.    Engorgement prevention and treatment reviewed.  Mom given lactation brochure and shown phone numbers for lactation support.  Lactation Tools Discussed/Used Tools: Pump;Bottle Breast pump type: Double-Electric Breast Pump Pumping frequency: 0 Pumped volume: 0 mL  Interventions Interventions: Breast feeding basics reviewed;Education  Discharge Discharge Education: Engorgement and breast care  Consult Status Consult Status: Complete Date: 06/25/21 Follow-up type: Call as needed    Judee Clara 06/25/2021, 2:12 PM

## 2021-06-27 ENCOUNTER — Ambulatory Visit: Payer: Medicaid Other

## 2021-07-03 ENCOUNTER — Other Ambulatory Visit: Payer: Self-pay

## 2021-07-03 ENCOUNTER — Ambulatory Visit (INDEPENDENT_AMBULATORY_CARE_PROVIDER_SITE_OTHER): Payer: Medicaid Other

## 2021-07-03 VITALS — BP 119/74 | HR 56 | Wt 281.6 lb

## 2021-07-03 DIAGNOSIS — Z5189 Encounter for other specified aftercare: Secondary | ICD-10-CM

## 2021-07-03 NOTE — Progress Notes (Signed)
Incision Check Visit  Here today following c-section on 06/23/21. Pt reports Provena turned off yesterday. Provena removed with adhesive remover. Steri strips covering incision; removed. Incision appears clean and dry. 2 small, open areas of pink, healing tissue (both less than 1 cm in length. Surrounding area beneath incision appears irritated and red. Encouraged good wound care. Pt instructed to pat fully try and apply abd pad to incision for next 4 days. Pt to return in 7 days for follow up with nurse visit. S/s of infection reviewed; pt will call with any concerns.  Reports good BM each day. No issue with pain. Currently bottle feeding.   Fleet Contras RN 07/03/21

## 2021-07-03 NOTE — Progress Notes (Signed)
Patient was assessed and managed by nursing staff during this encounter. I have reviewed the chart and agree with the documentation and plan. I have also made any necessary editorial changes.  Warden Fillers, MD 07/03/2021 1:05 PM

## 2021-07-04 ENCOUNTER — Encounter: Payer: Medicaid Other | Admitting: Certified Nurse Midwife

## 2021-07-05 ENCOUNTER — Telehealth (HOSPITAL_COMMUNITY): Payer: Self-pay | Admitting: *Deleted

## 2021-07-05 NOTE — Telephone Encounter (Signed)
Mom reports doing well. No concerns about herself. Incision healing well. EPDS in hospital = 0, PHQ2 =0 on 07/03/2021. Mom reports baby is well. No concerns about baby. Eating and peeing well. Maybe a little constipated, but she will talk to peds about changing formula this week at appt. Baby sleeps in crib in mom's room on back.  Duffy Rhody, RN 07-05-2021 at 12:14pm

## 2021-07-11 ENCOUNTER — Other Ambulatory Visit: Payer: Self-pay

## 2021-07-11 ENCOUNTER — Ambulatory Visit (INDEPENDENT_AMBULATORY_CARE_PROVIDER_SITE_OTHER): Payer: Medicaid Other

## 2021-07-11 VITALS — BP 122/71 | HR 62 | Wt 278.4 lb

## 2021-07-11 DIAGNOSIS — Z5189 Encounter for other specified aftercare: Secondary | ICD-10-CM

## 2021-07-11 NOTE — Patient Instructions (Addendum)
Stool Softener:  Colace Senakot Smooth Move Tea  Hemorrhoids:  Anusol  Anusol HC  Preparation H  Tucks

## 2021-07-11 NOTE — Progress Notes (Signed)
Here today for incision check, s/p c-section on 06/23/21. Incision is open to air; appears clean, dry, and intact. Redness seen prior on 07/03/21 to surrounding area has resolved.  Patient reports pain with bowel movement, states it is difficult to pass stool. Reports seeing blood following bowel movement. Pt states she has felt something "ball like" on rectum. Discussed possibility of hemorrhoids. Encouraged patient to begin stool softener otc. Recommended patient continue Miralax as needed. List of medications available otc for hemorrhoids; recommended pt start with Tucks pads. Will follow up at postpartum appointment 07/31/21 and as needed if constipation does not improve.  Fleet Contras RN 07/12/21

## 2021-07-12 NOTE — Progress Notes (Signed)
I agree with RN documentation and plan of care.  Evalina Field, MD

## 2021-07-31 ENCOUNTER — Other Ambulatory Visit: Payer: Self-pay

## 2021-07-31 ENCOUNTER — Ambulatory Visit (INDEPENDENT_AMBULATORY_CARE_PROVIDER_SITE_OTHER): Payer: Medicaid Other | Admitting: Student

## 2021-07-31 NOTE — Progress Notes (Signed)
Post Partum Visit Note  Charlotte [name] is a 25 y.o. G35P2002 female who presents for a postpartum visit. She is 5 weeks postpartum following a repeat cesarean section.  I have fully reviewed the prenatal and intrapartum course. The delivery was at 39 gestational weeks.  Anesthesia: epidural. Postpartum course has been. Baby is doing well. Baby is feeding by bottle Rush Barer . Bleeding staining only. Bowel function is abnormal: Pain and blood in stool . Bladder function is normal. Patient is sexually active. Contraception method is IUD. Postpartum depression screening: negative.   The pregnancy intention screening data noted above was reviewed. Potential methods of contraception were discussed. The patient elected to proceed with No data recorded.   Edinburgh Postnatal Depression Scale - 07/31/21 1505       Edinburgh Postnatal Depression Scale:  In the Past 7 Days   I have been able to laugh and see the funny side of things. 0    I have looked forward with enjoyment to things. 0    I have blamed myself unnecessarily when things went wrong. 0    I have been anxious or worried for no good reason. 2    I have felt scared or panicky for no good reason. 0    Things have been getting on top of me. 0    I have been so unhappy that I have had difficulty sleeping. 0    I have felt sad or miserable. 0    I have been so unhappy that I have been crying. 0    The thought of harming myself has occurred to me. 0    Edinburgh Postnatal Depression Scale Total 2             Health Maintenance Due  Topic Date Due   COVID-19 Vaccine (1) Never done   HPV VACCINES (1 - 2-dose series) Never done   INFLUENZA VACCINE  06/12/2021    The following portions of the patient's history were reviewed and updated as appropriate: allergies, current medications, past family history, past medical history, past social history, past surgical history, and problem list.  Review of Systems Pertinent items are  noted in HPI.  Objective:  BP 114/64   Pulse 66   Wt 283 lb 8 oz (128.6 kg)   LMP 08/01/2020   Breastfeeding No   BMI 44.40 kg/m    General:  alert, cooperative, and appears stated age  Lungs: clear to auscultation bilaterally  Heart:  regular rate and rhythm, S1, S2 normal, no murmur, click, rub or gallop  Abdomen: soft, non-tender; bowel sounds normal; no masses,  no organomegaly   Wound well approximated incision  GU exam:   Unable to view cervix due to body habitus. IUD strings palpated       Assessment:    There are no diagnoses linked to this encounter.  Normal postpartum exam.   Plan:   Essential components of care per ACOG recommendations:  1.  Mood and well being: Patient with negative depression screening today. Reviewed local resources for support.  - Patient tobacco use? No.   - hx of drug use? No.    2. Infant care and feeding:  -Patient currently breastmilk feeding? Yes. Discussed returning to work and pumping.  -Social determinants of health (SDOH) reviewed in EPIC. No concerns  3. Sexuality, contraception and birth spacing - Patient does not want a pregnancy in the next year.  Desired family size is 2 children.  - Reviewed forms  of contraception in tiered fashion. Patient desired IUD today.   - Discussed birth spacing of 18 months  4. Sleep and fatigue -Encouraged family/partner/community support of 4 hrs of uninterrupted sleep to help with mood and fatigue  5. Physical Recovery  - Discussed patients delivery and complications. She describes her labor as good. - Patient had a C-section repeat; no problems after deliver.  - Patient has urinary incontinence? No. - Patient is safe to resume physical and sexual activity  6.  Health Maintenance - HM due items addressed Yes - Last pap smear  Diagnosis  Date Value Ref Range Status  12/06/2020   Final   - Negative for intraepithelial lesion or malignancy (NILM)   Pap smear not done at today's visit.   -Breast Cancer screening indicated? No.   7. Chronic Disease/Pregnancy Condition follow up: None  - PCP follow up  Judeth Horn, NP Center for [employer], [facility]

## 2021-09-20 ENCOUNTER — Other Ambulatory Visit: Payer: Self-pay | Admitting: Lactation Services

## 2021-09-20 DIAGNOSIS — Z6841 Body Mass Index (BMI) 40.0 and over, adult: Secondary | ICD-10-CM

## 2021-09-20 NOTE — Progress Notes (Signed)
Diabetes and Nutrition Referral placed per Dr. Macon Large at patients request for assistance with weight loss.

## 2023-04-19 ENCOUNTER — Other Ambulatory Visit: Payer: Self-pay

## 2023-04-19 ENCOUNTER — Emergency Department (HOSPITAL_COMMUNITY)
Admission: EM | Admit: 2023-04-19 | Discharge: 2023-04-19 | Disposition: A | Discharge status: Home / Self Care | Payer: Medicaid Other | Attending: Emergency Medicine | Admitting: Emergency Medicine

## 2023-04-19 ENCOUNTER — Emergency Department (HOSPITAL_COMMUNITY): Payer: Medicaid Other

## 2023-04-19 DIAGNOSIS — M545 Low back pain, unspecified: Secondary | ICD-10-CM

## 2023-04-19 LAB — URINALYSIS, ROUTINE W REFLEX MICROSCOPIC
Bilirubin Urine: NEGATIVE
Glucose, UA: NEGATIVE mg/dL
Hgb urine dipstick: NEGATIVE
Ketones, ur: NEGATIVE mg/dL
Leukocytes,Ua: NEGATIVE
Nitrite: NEGATIVE
Protein, ur: NEGATIVE mg/dL
Specific Gravity, Urine: 1.02 (ref 1.005–1.030)
pH: 5 (ref 5.0–8.0)

## 2023-04-19 LAB — COMPREHENSIVE METABOLIC PANEL
ALT: 36 U/L (ref 0–44)
AST: 26 U/L (ref 15–41)
Albumin: 4 g/dL (ref 3.5–5.0)
Alkaline Phosphatase: 68 U/L (ref 38–126)
Anion gap: 8 (ref 5–15)
BUN: 11 mg/dL (ref 6–20)
CO2: 23 mmol/L (ref 22–32)
Calcium: 9.3 mg/dL (ref 8.9–10.3)
Chloride: 105 mmol/L (ref 98–111)
Creatinine, Ser: 0.77 mg/dL (ref 0.44–1.00)
GFR, Estimated: >60 mL/min (ref >60)
Glucose, Bld: 107 mg/dL — ABNORMAL HIGH (ref 70–99)
Potassium: 3.9 mmol/L (ref 3.5–5.1)
Sodium: 136 mmol/L (ref 135–145)
Total Bilirubin: 0.6 mg/dL (ref 0.3–1.2)
Total Protein: 7.2 g/dL (ref 6.5–8.1)

## 2023-04-19 LAB — CBC
HCT: 42.3 % (ref 36.0–46.0)
Hemoglobin: 14.2 g/dL (ref 12.0–15.0)
MCH: 31.6 pg (ref 26.0–34.0)
MCHC: 33.6 g/dL (ref 30.0–36.0)
MCV: 94.2 fL (ref 80.0–100.0)
Platelets: 250 10*3/uL (ref 150–400)
RBC: 4.49 MIL/uL (ref 3.87–5.11)
RDW: 12.1 % (ref 11.5–15.5)
WBC: 11.7 10*3/uL — ABNORMAL HIGH (ref 4.0–10.5)
nRBC: 0 % (ref 0.0–0.2)

## 2023-04-19 LAB — LIPASE, BLOOD: Lipase: 31 U/L (ref 11–51)

## 2023-04-19 LAB — I-STAT BETA HCG BLOOD, ED (MC, WL, AP ONLY): I-stat hCG, quantitative: <5 m[IU]/mL (ref <5)

## 2023-04-19 MED ORDER — METHOCARBAMOL 500 MG PO TABS: 500.0000 mg | ORAL_TABLET | Freq: Two times a day (BID) | ORAL | 0 refills | Status: AC | PRN

## 2023-04-19 MED ORDER — KETOROLAC TROMETHAMINE 60 MG/2ML IM SOLN
60.0000 mg | Freq: Once | INTRAMUSCULAR | Status: AC
  Administered 2023-04-19: 60 mg via INTRAMUSCULAR
  Filled 2023-04-19: qty 2

## 2023-04-19 MED ORDER — MELOXICAM 15 MG PO TABS: 15.0000 mg | ORAL_TABLET | Freq: Every day | ORAL | 0 refills | Status: AC

## 2023-04-19 MED ORDER — PREDNISONE 20 MG PO TABS: 40.0000 mg | ORAL_TABLET | Freq: Every day | ORAL | 0 refills | Status: AC

## 2023-04-19 NOTE — ED Provider Notes (Signed)
[place] EMERGENCY DEPARTMENT AT [facility] Provider Note   CSN: [id number] Arrival date & time: 04/19/23  0434     History  Chief Complaint  Patient presents with   Back Pain    Charlotte [name] is a 27 y.o. female.   Back Pain Associated symptoms: no fever      This patient is a 27 year old female, she has no prior back surgery history, she did have a epidural injection in 2016 when she had her first child, she has since that time done very well until the last couple of months when she started having some back pain.  This appears to be positional and gets worse when she stands up, it is better when she sits or lays down although she sometimes has pain in those positions as well.  She has no numbness or weakness of her legs but occasionally has a pain that shoots into her thigh on the left side.  There is no urinary incontinence or retention, she does endorse having bowel movements which sometimes have a bit of bright red blood on the paper but no melena or hematochezia.  She has no fevers or chills, no nausea or vomiting, no prior history of IV drug use or known history of cancer.  She has not had a formal evaluation for back pain in the past  Home Medications Prior to Admission medications   Medication Sig Start Date End Date Taking? Authorizing Provider  acetaminophen (TYLENOL) 325 MG tablet Take 2 tablets (650 mg total) by mouth every 6 (six) hours as needed. Patient not taking: No sig reported 06/25/21   Alfredo Dobkins, MD  coconut oil OIL Apply 1 application topically as needed. Patient not taking: No sig reported 06/25/21   Alfredo Trickett, MD  ferrous sulfate 325 (65 FE) MG tablet Take 1 tablet (325 mg total) by mouth every other day. Patient not taking: No sig reported 06/25/21 06/25/22  Katrinka Blazing, [place], CNM  ibuprofen (ADVIL) 600 MG tablet Take 1 tablet (600 mg total) by mouth every 6 (six) hours. Patient not taking: No sig reported 06/25/21   Alfredo Rojero, MD   oxyCODONE (OXY IR/ROXICODONE) 5 MG immediate release tablet Take 1 tablet (5 mg total) by mouth every 4 (four) hours as needed. Patient not taking: No sig reported 06/25/21   Katrinka Blazing, [place], CNM  polyethylene glycol powder (GLYCOLAX/MIRALAX) 17 GM/SCOOP powder Take 17 g by mouth daily as needed for moderate constipation. Patient not taking: Reported on 07/31/2021 06/25/21   Dorathy Kinsman, CNM  Prenatal Vit-Fe Fumarate-FA (MULTIVITAMIN-PRENATAL) 27-0.8 MG TABS tablet Take 1 tablet by mouth daily at 12 noon. Patient not taking: No sig reported    [provider]      Allergies    Patient has no known allergies.    Review of Systems   Review of Systems  Constitutional:  Negative for fever.  Gastrointestinal:  Positive for nausea.  Musculoskeletal:  Positive for back pain.    Physical Exam Updated Vital Signs BP 122/67   Pulse (!) 59   Temp 97.8 F (36.6 C) (Oral)   Resp 16   Wt 117.9 kg   LMP  (LMP Unknown) Comment: IUD  SpO2 98%   BMI 40.72 kg/m  Physical Exam Vitals and nursing note reviewed.  Constitutional:      Appearance: She is well-developed. She is not diaphoretic.  HENT:     Head: Normocephalic and atraumatic.  Eyes:     General:  Right eye: No discharge.        Left eye: No discharge.     Conjunctiva/sclera: Conjunctivae normal.  Pulmonary:     Effort: Pulmonary effort is normal. No respiratory distress.  Abdominal:     Tenderness: There is no abdominal tenderness.  Musculoskeletal:        General: Tenderness (across lower back - non focal, no midline ttp) present.  Skin:    General: Skin is warm and dry.     Findings: No erythema or rash.  Neurological:     Mental Status: She is alert.     Coordination: Coordination normal.     Comments: Normal gait, normal speech, normal strength in all 4 ext, normal sensation to LE's bilaterally and normal reflexes at patellar tendons bilaterally.     ED Results / Procedures / Treatments    Labs (all labs ordered are listed, but only abnormal results are displayed) Labs Reviewed  COMPREHENSIVE METABOLIC PANEL - Abnormal; Notable for the following components:      Result Value   Glucose, Bld 107 (*)    All other components within normal limits  CBC - Abnormal; Notable for the following components:   WBC 11.7 (*)    All other components within normal limits  LIPASE, BLOOD  URINALYSIS, ROUTINE W REFLEX MICROSCOPIC  I-STAT BETA HCG BLOOD, ED ([facility], [facility], [facility] ONLY)    EKG None  Radiology No results found.  Procedures Procedures    Medications Ordered in ED Medications - No data to display  ED Course/ Medical Decision Making/ A&P                             Medical Decision Making Amount and/or Complexity of Data Reviewed Labs: ordered. Radiology: ordered.  Risk Prescription drug management.    This patient presents to the ED for concern of back pain differential diagnosis includes musculoskeletal, ligamentous, could be radicular, she does not have any focal neurologic symptoms or findings on exam, she does have a slight antalgic gait    Additional history obtained:  Additional history obtained from medical record External records from outside source obtained and reviewed including prior delivery of a healthy child, no recent spinal imaging   Lab Tests:  I Ordered, and personally interpreted labs.  The pertinent results include: Pregnancy test negative, lipase and metabolic panel unremarkable, CBC with nonspecific leukocytosis of 11,700 but a normal hemoglobin of 14.2 and a urinalysis given spontaneously without any signs of ketones or infection   Imaging Studies ordered:  I ordered imaging studies including lumbosacral spine I independently visualized and interpreted imaging which showed no acute findings I agree with the radiologist interpretation   Medicines ordered and prescription drug management:  I ordered medication including prednisone  Robaxin and an anti-inflammatory for home for back pain I have reviewed the patients home medicines and have made adjustments as needed   Problem List / ED Course:  Imaging negative, patient well-appearing, no neurologic symptoms to suggest the need for an acute MRI or other aggressive workup, will be referred to spine as outpatient   Social Determinants of Health:   I have discussed with the patient at the bedside the results, and the meaning of these results.  They have expressed her understanding to the need for follow-up with primary care physician          Final Clinical Impression(s) / ED Diagnoses Final diagnoses:  Acute low back pain without  sciatica, unspecified back pain laterality    Rx / DC Orders ED Discharge Orders          Ordered    predniSONE (DELTASONE) 20 MG tablet  Daily        04/19/23 1046    methocarbamol (ROBAXIN) 500 MG tablet  2 times daily PRN        04/19/23 1046    meloxicam (MOBIC) 15 MG tablet  Daily        04/19/23 1046              Eber Hong, MD 04/19/23 1048

## 2023-04-19 NOTE — ED Triage Notes (Signed)
Pt arrives POV from home, presents with mid/lower back pain xfew months, worsened 1 wk ago. reports pain shoots into left leg.  Lower abd pain/hips.  Endorses nausea, spits up phlegm/saliva.  Last bm yesterday. States she feels like she has to go sometimes, but cannot.  Hx: c section   LMP: unknown, pt uses iud

## 2023-04-19 NOTE — Discharge Instructions (Signed)
Thankfully your x-ray was unremarkable, I would like for you to follow-up with a spinal doctor, please see the attached phone number, in the meantime you may take the following medications  Please take Mobic, 15 mg by mouth daily as needed for pain - this in an antiinflammatory medicine (NSAID) and is similar to ibuprofen - many people feel that it is stronger than ibuprofen and it is easier to take since it is a smaller pill.  Please use this only for 1 week - if your pain persists, you will need to follow up with your doctor in the office for ongoing guidance and pain control.  Please take Robaxin, 500 mg up to 2 or 3 times a day as needed for muscle spasm, this is a muscle relaxer, it may cause generalized weakness, sleepiness and you should not drive or do important things while taking this medication.  This includes driving a vehicle or taking care of young children, these things should not be done while taking this medication.   Prednisone is a steroid that helps to reduce certain types of inflammation and may be used for allergic reactions, some rashes such as poison ivy or dermatitis, for asthma attacks or bronchitis and for certain types of pain.  Please take this medicine exactly as prescribed - 40mg  by mouth daily for 5 days.  This can have certain side effects with some people including feeling like you can't sleep, feeling anxious or feeling like you are on a "high".  It should not cause weight gain if only taken for a short time.  Please be aware that this medication may also cause an elevation in your blood sugar if you are a diabetic so if you are a diabetic you will need to keep a very close eye on your blood sugar, make sure that you are eating an extremely low level of carbohydrates and taking your medications exactly as prescribed.  If you should develop severe high blood sugar or start to feel poorly return to the emergency department immediately   Thank you for allowing [place] to treat you  in the emergency department today.  After reviewing your examination and potential testing that was done it appears that you are safe to go home.  I would like for you to follow-up with your doctor within the next several days, have them obtain your results and follow-up with them to review all of these tests.  If you should develop severe or worsening symptoms return to the emergency department immediately

## 2023-10-21 ENCOUNTER — Ambulatory Visit: Payer: Medicaid Other | Admitting: Family

## 2023-11-22 ENCOUNTER — Ambulatory Visit: Payer: Medicaid Other | Admitting: Family

## 2024-02-28 ENCOUNTER — Other Ambulatory Visit: Payer: Self-pay

## 2024-02-28 ENCOUNTER — Encounter (HOSPITAL_BASED_OUTPATIENT_CLINIC_OR_DEPARTMENT_OTHER): Payer: Self-pay

## 2024-02-28 ENCOUNTER — Emergency Department (HOSPITAL_BASED_OUTPATIENT_CLINIC_OR_DEPARTMENT_OTHER)
Admission: EM | Admit: 2024-02-28 | Discharge: 2024-02-28 | Disposition: A | Discharge status: Home / Self Care | Attending: Emergency Medicine | Admitting: Emergency Medicine

## 2024-02-28 DIAGNOSIS — K2900 Acute gastritis without bleeding: Secondary | ICD-10-CM | POA: Insufficient documentation

## 2024-02-28 DIAGNOSIS — R109 Unspecified abdominal pain: Secondary | ICD-10-CM | POA: Diagnosis present

## 2024-02-28 LAB — BASIC METABOLIC PANEL WITH GFR
Anion gap: 6 (ref 5–15)
BUN: 16 mg/dL (ref 6–20)
CO2: 28 mmol/L (ref 22–32)
Calcium: 9.3 mg/dL (ref 8.9–10.3)
Chloride: 108 mmol/L (ref 98–111)
Creatinine, Ser: 0.74 mg/dL (ref 0.44–1.00)
GFR, Estimated: >60 mL/min (ref >60)
Glucose, Bld: 107 mg/dL — ABNORMAL HIGH (ref 70–99)
Potassium: 4 mmol/L (ref 3.5–5.1)
Sodium: 142 mmol/L (ref 135–145)

## 2024-02-28 LAB — CBC WITH DIFFERENTIAL/PLATELET
Abs Immature Granulocytes: 0.02 10*3/uL (ref 0.00–0.07)
Basophils Absolute: 0.1 10*3/uL (ref 0.0–0.1)
Basophils Relative: 1 %
Eosinophils Absolute: 0.3 10*3/uL (ref 0.0–0.5)
Eosinophils Relative: 4 %
HCT: 41.4 % (ref 36.0–46.0)
Hemoglobin: 13.8 g/dL (ref 12.0–15.0)
Immature Granulocytes: 0 %
Lymphocytes Relative: 36 %
Lymphs Abs: 3.3 10*3/uL (ref 0.7–4.0)
MCH: 31.2 pg (ref 26.0–34.0)
MCHC: 33.3 g/dL (ref 30.0–36.0)
MCV: 93.5 fL (ref 80.0–100.0)
Monocytes Absolute: 0.6 10*3/uL (ref 0.1–1.0)
Monocytes Relative: 7 %
Neutro Abs: 4.7 10*3/uL (ref 1.7–7.7)
Neutrophils Relative %: 52 %
Platelets: 269 10*3/uL (ref 150–400)
RBC: 4.43 MIL/uL (ref 3.87–5.11)
RDW: 11.7 % (ref 11.5–15.5)
WBC: 9 10*3/uL (ref 4.0–10.5)
nRBC: 0 % (ref 0.0–0.2)

## 2024-02-28 LAB — PREGNANCY, URINE: Preg Test, Ur: NEGATIVE

## 2024-02-28 LAB — URINALYSIS, ROUTINE W REFLEX MICROSCOPIC
Bilirubin Urine: NEGATIVE
Glucose, UA: NEGATIVE mg/dL
Hgb urine dipstick: NEGATIVE
Ketones, ur: NEGATIVE mg/dL
Leukocytes,Ua: NEGATIVE
Nitrite: NEGATIVE
Protein, ur: NEGATIVE mg/dL
Specific Gravity, Urine: 1.026 (ref 1.005–1.030)
pH: 6 (ref 5.0–8.0)

## 2024-02-28 LAB — LIPASE, BLOOD: Lipase: 38 U/L (ref 11–51)

## 2024-02-28 MED ORDER — ONDANSETRON 4 MG PO TBDP
4.0000 mg | ORAL_TABLET | Freq: Once | ORAL | Status: AC
  Administered 2024-02-28: 4 mg via ORAL
  Filled 2024-02-28: qty 1

## 2024-02-28 MED ORDER — FAMOTIDINE 20 MG PO TABS
40.0000 mg | ORAL_TABLET | Freq: Once | ORAL | Status: AC
  Administered 2024-02-28: 40 mg via ORAL
  Filled 2024-02-28: qty 2

## 2024-02-28 MED ORDER — PANTOPRAZOLE SODIUM 40 MG PO TBEC: 40.0000 mg | DELAYED_RELEASE_TABLET | Freq: Every day | ORAL | 0 refills | Status: AC

## 2024-02-28 MED ORDER — ALUM & MAG HYDROXIDE-SIMETH 200-200-20 MG/5ML PO SUSP
30.0000 mL | Freq: Once | ORAL | Status: AC
  Administered 2024-02-28: 30 mL via ORAL
  Filled 2024-02-28: qty 30

## 2024-02-28 MED ORDER — HYOSCYAMINE SULFATE 0.125 MG SL SUBL: 0.1250 mg | SUBLINGUAL_TABLET | SUBLINGUAL | 0 refills | Status: AC | PRN

## 2024-02-28 MED ORDER — HYOSCYAMINE SULFATE 0.125 MG SL SUBL
0.2500 mg | SUBLINGUAL_TABLET | Freq: Once | SUBLINGUAL | Status: AC
  Administered 2024-02-28: 0.25 mg via SUBLINGUAL
  Filled 2024-02-28: qty 2

## 2024-02-28 MED ORDER — SUCRALFATE 1 G PO TABS: 1.0000 g | ORAL_TABLET | Freq: Three times a day (TID) | ORAL | 0 refills | Status: AC

## 2024-02-28 NOTE — ED Triage Notes (Signed)
 Pt reports epigastric pain started at 2am Describes pain as a burning pain +chills +nausea/vomiting

## 2024-02-28 NOTE — ED Provider Notes (Signed)
 [place] EMERGENCY DEPARTMENT AT [facility] Provider Note   CSN: [id number] Arrival date & time: 02/28/24  0308     History  Chief Complaint  Patient presents with   Abdominal Pain    Charlotte [name] is a 28 y.o. female.  Patient presents to the emergency department for evaluation of abdominal discomfort.  She reports that she woke up 2 hours ago with burning pain in the center of her upper abdomen.       Home Medications Prior to Admission medications   Medication Sig Start Date End Date Taking? Authorizing Provider  hyoscyamine  (LEVSIN/SL) 0.125 MG SL tablet Place 1 tablet (0.125 mg total) under the tongue every 4 (four) hours as needed. 02/28/24  Yes Percell Lamboy, [place] Sia, MD  pantoprazole  (PROTONIX ) 40 MG tablet Take 1 tablet (40 mg total) by mouth daily. 02/28/24  Yes Taiwana Willison, [place] Sia, MD  sucralfate  (CARAFATE ) 1 g tablet Take 1 tablet (1 g total) by mouth 4 (four) times daily -  with meals and at bedtime. 02/28/24  Yes Makiah Foye, [place] Sia, MD  meloxicam  (MOBIC ) 15 MG tablet Take 1 tablet (15 mg total) by mouth daily. 04/19/23   Early Glisson, MD  methocarbamol  (ROBAXIN ) 500 MG tablet Take 1 tablet (500 mg total) by mouth 2 (two) times daily as needed for muscle spasms. 04/19/23   Early Glisson, MD  predniSONE  (DELTASONE ) 20 MG tablet Take 2 tablets (40 mg total) by mouth daily. 04/19/23   Early Glisson, MD      Allergies    Patient has no known allergies.    Review of Systems   Review of Systems  Physical Exam Updated Vital Signs BP 124/71   Pulse (!) 56   Temp 98.2 F (36.8 C)   Resp 18   Ht 5\' 8"  (1.727 m)   Wt 127 kg   SpO2 94%   BMI 42.57 kg/m  Physical Exam Vitals and nursing note reviewed.  Constitutional:      General: She is not in acute distress.    Appearance: She is well-developed.  HENT:     Head: Normocephalic and atraumatic.     Mouth/Throat:     Mouth: Mucous membranes are moist.  Eyes:     General: Vision grossly  intact. Gaze aligned appropriately.     Extraocular Movements: Extraocular movements intact.     Conjunctiva/sclera: Conjunctivae normal.  Cardiovascular:     Rate and Rhythm: Normal rate and regular rhythm.     Pulses: Normal pulses.     Heart sounds: Normal heart sounds, S1 normal and S2 normal. No murmur heard.    No friction rub. No gallop.  Pulmonary:     Effort: Pulmonary effort is normal. No respiratory distress.     Breath sounds: Normal breath sounds.  Abdominal:     General: Bowel sounds are normal.     Palpations: Abdomen is soft.     Tenderness: There is abdominal tenderness in the epigastric area. There is no guarding or rebound.     Hernia: No hernia is present.  Musculoskeletal:        General: No swelling.     Cervical back: Full passive range of motion without pain, normal range of motion and neck supple. No spinous process tenderness or muscular tenderness. Normal range of motion.     Right lower leg: No edema.     Left lower leg: No edema.  Skin:    General: Skin is warm and dry.  Capillary Refill: Capillary refill takes less than 2 seconds.     Findings: No ecchymosis, erythema, rash or wound.  Neurological:     General: No focal deficit present.     Mental Status: She is alert and oriented to person, place, and time.     GCS: GCS eye subscore is 4. GCS verbal subscore is 5. GCS motor subscore is 6.     Cranial Nerves: Cranial nerves 2-12 are intact.     Sensory: Sensation is intact.     Motor: Motor function is intact.     Coordination: Coordination is intact.  Psychiatric:        Attention and Perception: Attention normal.        Mood and Affect: Mood normal.        Speech: Speech normal.        Behavior: Behavior normal.     ED Results / Procedures / Treatments   Labs (all labs ordered are listed, but only abnormal results are displayed) Labs Reviewed  BASIC METABOLIC PANEL WITH GFR - Abnormal; Notable for the following components:      Result  Value   Glucose, Bld 107 (*)    All other components within normal limits  CBC WITH DIFFERENTIAL/PLATELET  LIPASE, BLOOD  URINALYSIS, ROUTINE W REFLEX MICROSCOPIC  PREGNANCY, URINE    EKG None  Radiology No results found.  Procedures Procedures    Medications Ordered in ED Medications  ondansetron  (ZOFRAN -ODT) disintegrating tablet 4 mg (4 mg Oral Given 02/28/24 0323)  famotidine  (PEPCID ) tablet 40 mg (40 mg Oral Given 02/28/24 0425)  alum & mag hydroxide-simeth (MAALOX/MYLANTA) 200-200-20 MG/5ML suspension 30 mL (30 mLs Oral Given 02/28/24 0425)  hyoscyamine  (LEVSIN SL) SL tablet 0.25 mg (0.25 mg Sublingual Given 02/28/24 0425)    ED Course/ Medical Decision Making/ A&P                                 Medical Decision Making Amount and/or Complexity of Data Reviewed Labs: ordered.  Risk OTC drugs. Prescription drug management.   Patient with nonradiating epigastric discomfort for 2 hours.  No previous surgeries.  Patient does not have tenderness in the right upper quadrant to suggest acute gallbladder disease.  Lab work is reassuringly normal.  No signs of an acute surgical process on exam.        Final Clinical Impression(s) / ED Diagnoses Final diagnoses:  Acute gastritis without hemorrhage, unspecified gastritis type    Rx / DC Orders ED Discharge Orders          Ordered    pantoprazole  (PROTONIX ) 40 MG tablet  Daily        02/28/24 0544    sucralfate  (CARAFATE ) 1 g tablet  3 times daily with meals & bedtime        02/28/24 0544    hyoscyamine  (LEVSIN/SL) 0.125 MG SL tablet  Every 4 hours PRN        02/28/24 0544              Ballard Bongo, MD 02/28/24 [phone]

## 2024-08-04 ENCOUNTER — Telehealth: Admitting: Physician Assistant

## 2024-08-04 DIAGNOSIS — M549 Dorsalgia, unspecified: Secondary | ICD-10-CM

## 2024-08-04 DIAGNOSIS — R102 Pelvic and perineal pain: Secondary | ICD-10-CM

## 2024-08-04 NOTE — Progress Notes (Signed)
  Because of need for assessment -- including examination and imaging at this point -- to determine cause of symptoms and proper treatments, I feel your condition warrants further evaluation and I recommend that you be seen in a face-to-face visit.   NOTE: There will be NO CHARGE for this E-Visit   If you are having a true medical emergency, please call 911.     For an urgent face to face visit, [place] has multiple urgent care centers for your convenience.  Click the link below for the full list of locations and hours, walk-in wait times, appointment scheduling options and driving directions:  Urgent Care - [place], [place], [place], [place], [place], [place]  [place]     Your MyChart E-visit questionnaire answers were reviewed by a board certified advanced clinical practitioner to complete your personal care plan based on your specific symptoms.    Thank you for using e-Visits.
